# Patient Record
Sex: Female | Born: 1995 | Race: White | Hispanic: No | Marital: Single
Health system: Southern US, Community
[De-identification: ages and names within clinical notes are randomized; demographics above are authoritative.]

## PROBLEM LIST (undated history)

## (undated) DIAGNOSIS — J45909 Unspecified asthma, uncomplicated: Secondary | ICD-10-CM

## (undated) DIAGNOSIS — R51 Headache: Secondary | ICD-10-CM

## (undated) DIAGNOSIS — K219 Gastro-esophageal reflux disease without esophagitis: Secondary | ICD-10-CM

## (undated) HISTORY — DX: Unspecified asthma, uncomplicated: J45.909

## (undated) HISTORY — DX: Headache: R51

---
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
## 2017-03-17 ENCOUNTER — Ambulatory Visit (INDEPENDENT_AMBULATORY_CARE_PROVIDER_SITE_OTHER): Admitting: Family

## 2017-03-17 ENCOUNTER — Encounter (INDEPENDENT_AMBULATORY_CARE_PROVIDER_SITE_OTHER): Payer: Self-pay | Admitting: Family

## 2017-03-17 VITALS — BP 100/70 | HR 60 | Ht 65.0 in | Wt 118.8 lb

## 2017-03-17 DIAGNOSIS — M542 Cervicalgia: Secondary | ICD-10-CM | POA: Diagnosis not present

## 2017-03-17 DIAGNOSIS — G43109 Migraine with aura, not intractable, without status migrainosus: Secondary | ICD-10-CM | POA: Diagnosis not present

## 2017-03-17 DIAGNOSIS — Z8739 Personal history of other diseases of the musculoskeletal system and connective tissue: Secondary | ICD-10-CM

## 2017-03-17 DIAGNOSIS — G44229 Chronic tension-type headache, not intractable: Secondary | ICD-10-CM | POA: Diagnosis not present

## 2017-03-17 DIAGNOSIS — G43009 Migraine without aura, not intractable, without status migrainosus: Secondary | ICD-10-CM

## 2017-03-17 MED ORDER — TOPIRAMATE 25 MG PO TABS: ORAL_TABLET | ORAL | 5 refills | Status: DC

## 2017-03-17 MED ORDER — TIZANIDINE HCL 2 MG PO TABS: ORAL_TABLET | ORAL | 5 refills | Status: DC

## 2017-03-17 NOTE — Progress Notes (Signed)
Patient: Hailey Hodge MRN: [id number] Sex: female DOB: [birth year]  Provider: Elveria Rising, NP Location of Care: [facility] Child Neurology  Note type: Routine return visit  History of Present Illness: Referral Source: Juleen China MD History from: patient and [facility] chart Chief Complaint: Follow up on Migraines and needs refill on Zanaflex Hailey Hodge is a 21 year old young woman with history of tension and migraine headaches. She was last seen April 22, 2016. Hailey Hodge is taking and tolerating Topiramate 50mg  for prevention of migraine headaches. She also has tension headaches that typically do not require treatment. Hailey Hodge tells me today that she was doing well until about a couple of months ago and decided to stop taking Topiramate. She did so and had no migraines for about a month, then had a severe migraine. She decided to restart the medication and has done well since then.   When Hailey Hodge has a migraine, she takes Ibuprofen 600mg  or Tylenol. She sometimes needs to take Tizanidine or Promethazine if the migraine is particularly severe. She usually has to sleep to get relief.   Hailey Hodge is attending [employer] and says that she is doing well academically. She hopes to transfer to [employer] after [employer] her coursework at the [employer].   Darshay has been generally health since she was last seen. She has no other health concerns today other than previously mentioned.  Review of Systems: Please see the HPI for neurologic and other pertinent review of systems. Otherwise, the following systems are noncontributory including constitutional, eyes, ears, nose and throat, cardiovascular, respiratory, gastrointestinal, genitourinary, musculoskeletal, skin, endocrine, hematologic/lymph, allergic/immunologic and psychiatric.   Past Medical History:  Diagnosis Date  . Asthma May 2013  . Headache(784.0)    Hospitalizations: No., Head Injury: No.,  Nervous System Infections: No., Immunizations up to date: Yes.   Past Medical History Comments: See history  Surgical History Past Surgical History:  Procedure Laterality Date  . SALPINGOOPHORECTOMY  04/08/98    Family History family history includes Anxiety disorder in her mother; Bipolar disorder in her maternal grandmother; Colon cancer in her maternal grandfather; Depression in her maternal grandmother; Migraines in her maternal grandmother and mother; Stroke in her paternal grandfather. Family History is otherwise negative for migraines, seizures, cognitive impairment, blindness, deafness, birth defects, chromosomal disorder, autism.  Social History Social History   Social History  . Marital status: Single    Spouse name: N/A  . Number of children: N/A  . Years of education: N/A   Social History Main Topics  . Smoking status: Passive Smoke Exposure - Never Smoker  . Smokeless tobacco: Never Used  . Alcohol use No  . Drug use: No  . Sexual activity: No   Other Topics Concern  . 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 She will be attending [employer].    [name] lives her mom and her sister.   Hailey Hodge enjoys homework, TV, social media, book clubs, key club, music and dance.   Hailey Hodge does well in school.    Allergies No Known Allergies  Physical Exam BP 100/70   Pulse 60   Ht 5\' 5"  (1.651 m)   Wt 118 lb 12.8 oz (53.9 kg)   BMI 19.77 kg/m  General: well developed, well nourished girl, seated on exam table, in no evident distress. Right handed.  Head: head normocephalic and atraumatic. Ears, Nose and Throat: Oropharynx benign. Neck: supple with no carotid or supraclavicular bruits.  Respiratory: lungs clear to auscultation  Cardiovascular: regular rate and rhythm, no murmurs  Musculoskeletal: No obvious deformities or scoliosis. She has full range of motion and is able to perform flexion, extension and rotation maneuvers  of her spine. Skin: no rashes or lesions   Neurologic Exam  Mental Status: Awake and fully alert. Oriented to place and time. Recent and remote memory intact. Attention span, concentration, and fund of knowledge appropriate. Mood and affect appropriate.  Cranial Nerves: Fundoscopic exam reveals sharp disc margins. Pupils equal, briskly reactive to light. Extraocular movements full without nystagmus. Visual fields full to confrontation. Hearing intact and symmetric to finger rub. Facial sensation intact. Face, tongue, palate move normally and symmetrically. Neck flexion and extension normal.  Motor: Normal bulk and tone. Normal strength in all tested extremity muscles.  Sensory: Intact to touch and temperature in all extremities.  Coordination: Rapid alternating movements normal in all extremities. Finger-to-nose and heel-to-shin performed accurately bilaterally. Romberg negative.  Gait and Station: Arises from chair without difficulty. Stance is normal. Gait demonstrates normal stride length and balance. Able to heel, toe, and tandem walk without difficulty. Gower negative.  Reflexes: Diminished and symmetric. Toes downgoing.  Impression 1. Migraine without aura. 2. She has had episodes of migraine with aura but it is rare 3. Episodic tension headache  4. Neck pain 5. History of asthma 6. History of scoliosis  Recommendations for plan of care The [facility] were [facility]. [name] has neither had nor required imaging or lab studies since the last visit. She is a 21 year old young woman with history of tension and migraine headaches. She is taking and toleratingTopiramate for migraine prevention. We talked about her headaches and I reminded [name] of usual migraine triggers such as skipping meals, not drinking enough water, sleep deprivation and stress. She will continue her medication without change for now. 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 I reminded Aerie that these medications should not be taken by pregnant women and that she should take appropriate precautions if she becomes sexually active. I will see Francessca back in follow up in 6 months or sooner if needed.   The medication list was reviewed and reconciled.  No changes were made in the prescribed medications today.  A complete medication list was provided to the patient.  Allergies as of 03/17/2017   No Known Allergies     Medication List       Accurate as of 03/17/17  7:28 PM. Always use your most recent med list.          acetaminophen 325 MG tablet Commonly known as:  TYLENOL Take 2 tablets at onset of headache, may repeat every 4-6 hours as needed.   albuterol 108 (90 Base) MCG/ACT inhaler Commonly known as:  PROVENTIL HFA;VENTOLIN HFA Inhale 2 puffs into the lungs every 6 (six) hours as needed for wheezing.   ibuprofen 600 MG tablet Commonly known as:  ADVIL,MOTRIN Take 1 tablet at onset of migraine. May repeat in 6 hours if needed.   LO LOESTRIN FE 1 MG-10 MCG / 10 MCG tablet Generic drug:  Norethindrone-Ethinyl Estradiol-Fe Biphas TAKE 1 TABLET BY MOUTH DAILY   montelukast 10 MG tablet Commonly known as:  SINGULAIR Take 10 mg by mouth.   promethazine 12.5 MG tablet Commonly known as:  PHENERGAN Take  1 tablet at onset of nausea with severe migraines. May repeat in 6 hours if needed.   tiZANidine 2 MG tablet Commonly known as:  ZANAFLEX Take 1 tablet at bedtime when needed for neck pain   topiramate 25 MG tablet Commonly known as:  TOPAMAX Take 2 tablets at bedtime       Total time spent with the patient was 25 minutes, of which 50% or more was spent in counseling and coordination of care.   Elveria Rising NP-C

## 2017-03-17 NOTE — Patient Instructions (Signed)
Continue your medication as you have been taking it.   Let me know if your headaches become more frequent or more severe.   Remember to avoid skipping meals, to drink plenty of water and to get 8 to 9 hours of sleep each night to avoid triggering headaches.   Also remember that Topiramate can make oral contraceptives potentially less effective as contraception and if you are sexually active, that a second barrier method such as condoms should be used.   Please sign up for MyChart.   Please plan to return for follow up in 6 months or sooner if needed.

## 2018-12-01 ENCOUNTER — Ambulatory Visit (INDEPENDENT_AMBULATORY_CARE_PROVIDER_SITE_OTHER): Payer: Self-pay | Admitting: Family

## 2018-12-02 ENCOUNTER — Encounter (INDEPENDENT_AMBULATORY_CARE_PROVIDER_SITE_OTHER): Payer: Self-pay | Admitting: Family

## 2018-12-02 ENCOUNTER — Ambulatory Visit (INDEPENDENT_AMBULATORY_CARE_PROVIDER_SITE_OTHER): Admitting: Family

## 2018-12-02 ENCOUNTER — Other Ambulatory Visit: Payer: Self-pay

## 2018-12-02 VITALS — BP 108/72 | HR 72 | Ht 65.25 in | Wt 124.0 lb

## 2018-12-02 DIAGNOSIS — G43009 Migraine without aura, not intractable, without status migrainosus: Secondary | ICD-10-CM | POA: Diagnosis not present

## 2018-12-02 DIAGNOSIS — Z8739 Personal history of other diseases of the musculoskeletal system and connective tissue: Secondary | ICD-10-CM

## 2018-12-02 DIAGNOSIS — G43109 Migraine with aura, not intractable, without status migrainosus: Secondary | ICD-10-CM | POA: Diagnosis not present

## 2018-12-02 DIAGNOSIS — G44229 Chronic tension-type headache, not intractable: Secondary | ICD-10-CM | POA: Diagnosis not present

## 2018-12-02 MED ORDER — IBUPROFEN 600 MG PO TABS: ORAL_TABLET | ORAL | 5 refills | Status: DC

## 2018-12-02 NOTE — Patient Instructions (Signed)
Thank you for coming in today.   Instructions for you until your next appointment are as follows: 1. Remember that it is important for you to be well hydrated, to avoid skipping meals and to get at least 8-9 hours of sleep each night.  2. Let me know if your headaches become more frequent or more severe.  3. Please sign up for MyChart if you have not done so 4. Please plan to return for follow up in one year or sooner if needed.

## 2018-12-02 NOTE — Progress Notes (Signed)
Patient: Hailey Hodge MRN: [id number] Sex: female DOB: Mar 05, 1996  Provider: Elveria Rising, NP Location of Care: [facility] Child Neurology  Note type: Routine return visit  History of Present Illness: Referral Source: Juleen China, MD History from: patient and [facility] chart Chief Complaint: Migraines  Hailey Hodge is a 23 y.o. young woman with history of migraine and tension headaches. She was last seen March 17, 2017. At that time she was taking and tolerating Topiramate for migraine prevention. She tells me today that she has stopped it because her migraine frequency improved. She has not experienced recurrence of frequent migraines since stopping the Topiramate. Hailey Hodge reports that she has occasional migraine that is easily managed with Ibuprofen and increasing water intake. With migraines Hailey Hodge has visual disturbance and holocephalic pain. She also has occasional tension headaches that are milder frontal pain, typically relieved by rest and hydration.  Hailey Hodge is attending [employer] and majoring in international studies. She is currently enrolled in online classes because of restrictions related to Covid-19 pandemic. She says that classes are going well for the most part. She is having some difficulty in her foreign language class ([place]) but says the professor is working with her.   Hailey Hodge has been otherwise generally healthy since she was last seen. She has no other health concerns today other than previously mentioned.  Review of Systems: Please see the HPI for neurologic and other pertinent review of systems. Otherwise, all other systems were reviewed and were negative.    Past Medical History:  Diagnosis Date  . Asthma May 2013  . Headache(784.0)    Hospitalizations: No., Head Injury: No., Nervous System Infections: No., Immunizations up to date: Yes.   Past Medical History Comments: See HPI   Surgical History Past Surgical History:  Procedure Laterality Date   . SALPINGOOPHORECTOMY  12-03-97    Family History family history includes Anxiety disorder in her mother; Bipolar disorder in her maternal grandmother; Colon cancer in her maternal grandfather; Depression in her maternal grandmother; Migraines in her maternal grandmother and mother; Stroke in her paternal grandfather. Family History is otherwise negative for migraines, seizures, cognitive impairment, blindness, deafness, birth defects, chromosomal disorder, autism.  Social History Social History   Socioeconomic History  . Marital status: Single    Spouse name: Not on file  . Number of children: Not on file  . Years of education: Not on file  . Highest education level: Not on file  Occupational History  . Not on file  Social Needs  . Financial resource strain: Not on file  . Food insecurity:    Worry: Not on file    Inability: Not on file  . Transportation needs:    Medical: Not on file    Non-medical: Not on file  Tobacco Use  . Smoking status: Passive Smoke Exposure - Never Smoker  . Smokeless tobacco: Never Used  Substance and Sexual Activity  . Alcohol use: No  . Drug use: No  . Sexual activity: Never  Lifestyle  . Physical activity:    Days per week: Not on file    Minutes per session: Not on file  . Stress: Not on file  Relationships  . Social connections:    Talks on phone: Not on file    Gets together: Not on file    Attends religious service: Not on file    Active member of club or organization: Not on file    Attends meetings of clubs or organizations: Not  on file    Relationship status: Not on file  Other Topics Concern  . Not on file  Social History Narrative   Mialani graduated from [employer].    She is a Holiday representative at [employer].   She lives with her mom and her sister.   Bayleigh enjoys homework, TV, social media, book clubs, key club, music and dance.   Working at [employer]     Allergies Allergies  Allergen Reactions  . Prednisone      Other reaction(s): Other (See Comments) "my muscles tightened up"    Physical Exam BP 108/72   Pulse 72   Ht 5' 5.25" (1.657 m)   Wt 124 lb (56.2 kg)   BMI 20.48 kg/m  General: Well developed, well nourished young woman, seated on exam table, in no evident distress, sandy hair, brown eyes, right handed Head: Head normocephalic and atraumatic.  Oropharynx benign. Neck: Supple with no carotid bruits Cardiovascular: Regular rate and rhythm, no murmurs Respiratory: Breath sounds clear to auscultation Musculoskeletal: No obvious deformities or scoliosis Skin: No rashes or neurocutaneous lesions  Neurologic Exam Mental Status: Awake and fully alert.  Oriented to place and time.  Recent and remote memory intact.  Attention span, concentration, and fund of knowledge appropriate.  Mood and affect appropriate. Cranial Nerves: Fundoscopic exam reveals sharp disc margins.  Pupils equal, briskly reactive to light.  Extraocular movements full without nystagmus.  Visual fields full to confrontation.  Hearing intact and symmetric to finger rub.  Facial sensation intact.  Face tongue, palate move normally and symmetrically.  Neck flexion and extension normal. Motor: Normal bulk and tone. Normal strength in all tested extremity muscles. Sensory: Intact to touch and temperature in all extremities.  Coordination: Rapid alternating movements normal in all extremities.  Finger-to-nose and heel-to shin performed accurately bilaterally.  Romberg negative. Gait and Station: Arises from chair without difficulty.  Stance is normal. Gait demonstrates normal stride length and balance.   Able to heel, toe and tandem walk without difficulty. Reflexes: 1+ and symmetric. Toes downgoing.  Impression 1. Migraine without aura 2. Rare episodes of migraine with aura 3. Tension headaches   PHQ-SADS Score Only 12/02/2018  PHQ-15 3  GAD-7 0  Anxiety attacks No  PHQ-9 0  Suicidal Ideation No  Any difficulty to complete  tasks? Not difficult at all     Recommendations for plan of care The patient's previous [facility] records were reviewed. Hailey Hodge has neither had nor required imaging or lab studies since the last visit. She is a 23 year old woman with history of migraine and tension headaches. She used to take Topiramate for migraine prevention but stopped it when the migraine frequency diminished. She takes Ibuprofen and drinks water when a migraine occurs, and this has worked to give her relief. She has occasional tension headaches that are relieved by rest and hydration. I reminded Hailey Hodge of the need to drink plenty of water each day, to avoid skipping meals and to get at least 8-9 hours of sleep each night. I will see her back in follow up in 1 year or sooner if needed. Hailey Hodge agreed with the plans made today.   The medication list was reviewed and reconciled.  No changes were made in the prescribed medications today.  A complete medication list was provided to the patient.  Allergies as of 12/02/2018      Reactions   Prednisone    Other reaction(s): Other (See Comments) "my muscles tightened up"  Medication List       Accurate as of December 02, 2018 11:38 AM. Always use your most recent med list.        acetaminophen 325 MG tablet Commonly known as:  TYLENOL Take 2 tablets at onset of headache, may repeat every 4-6 hours as needed.   albuterol 108 (90 Base) MCG/ACT inhaler Commonly known as:  PROVENTIL HFA;VENTOLIN HFA Inhale 2 puffs into the lungs every 6 (six) hours as needed for wheezing.   ibuprofen 600 MG tablet Commonly known as:  ADVIL,MOTRIN Take 1 tablet at onset of migraine. May repeat in 6 hours if needed.   Lo Loestrin Fe 1 MG-10 MCG / 10 MCG tablet Generic drug:  Norethindrone-Ethinyl Estradiol-Fe Biphas TAKE 1 TABLET BY MOUTH DAILY   montelukast 10 MG tablet Commonly known as:  SINGULAIR Take 10 mg by mouth.   norethindrone-ethinyl estradiol 1-20 MG-MCG tablet Commonly  known as:  MICROGESTIN,JUNEL,LOESTRIN TK 1 T PO QD       Total time spent with the patient was 20 minutes, of which 50% or more was spent in counseling and coordination of care.   Elveria Rising NP-C

## 2019-03-26 DIAGNOSIS — Z01419 Encounter for gynecological examination (general) (routine) without abnormal findings: Secondary | ICD-10-CM | POA: Diagnosis not present

## 2019-04-11 DIAGNOSIS — Z20828 Contact with and (suspected) exposure to other viral communicable diseases: Secondary | ICD-10-CM | POA: Diagnosis not present

## 2019-07-20 DIAGNOSIS — Z23 Encounter for immunization: Secondary | ICD-10-CM | POA: Diagnosis not present

## 2019-07-26 DIAGNOSIS — J00 Acute nasopharyngitis [common cold]: Secondary | ICD-10-CM | POA: Diagnosis not present

## 2019-08-30 DIAGNOSIS — R05 Cough: Secondary | ICD-10-CM | POA: Diagnosis not present

## 2019-11-15 DIAGNOSIS — Z20828 Contact with and (suspected) exposure to other viral communicable diseases: Secondary | ICD-10-CM | POA: Diagnosis not present

## 2019-11-15 DIAGNOSIS — J019 Acute sinusitis, unspecified: Secondary | ICD-10-CM | POA: Diagnosis not present

## 2020-03-01 DIAGNOSIS — Z20828 Contact with and (suspected) exposure to other viral communicable diseases: Secondary | ICD-10-CM | POA: Diagnosis not present

## 2020-03-01 DIAGNOSIS — J029 Acute pharyngitis, unspecified: Secondary | ICD-10-CM | POA: Diagnosis not present

## 2020-03-01 DIAGNOSIS — J0100 Acute maxillary sinusitis, unspecified: Secondary | ICD-10-CM | POA: Diagnosis not present

## 2020-07-10 DIAGNOSIS — J329 Chronic sinusitis, unspecified: Secondary | ICD-10-CM | POA: Diagnosis not present

## 2020-07-10 DIAGNOSIS — Z20828 Contact with and (suspected) exposure to other viral communicable diseases: Secondary | ICD-10-CM | POA: Diagnosis not present

## 2020-08-01 DIAGNOSIS — Z3041 Encounter for surveillance of contraceptive pills: Secondary | ICD-10-CM | POA: Diagnosis not present

## 2020-11-16 DIAGNOSIS — Z1331 Encounter for screening for depression: Secondary | ICD-10-CM | POA: Diagnosis not present

## 2020-11-16 DIAGNOSIS — Z6821 Body mass index (BMI) 21.0-21.9, adult: Secondary | ICD-10-CM | POA: Diagnosis not present

## 2020-11-16 DIAGNOSIS — K219 Gastro-esophageal reflux disease without esophagitis: Secondary | ICD-10-CM | POA: Diagnosis not present

## 2020-11-16 DIAGNOSIS — Z23 Encounter for immunization: Secondary | ICD-10-CM | POA: Diagnosis not present

## 2020-11-16 DIAGNOSIS — Z0000 Encounter for general adult medical examination without abnormal findings: Secondary | ICD-10-CM | POA: Diagnosis not present

## 2022-06-08 ENCOUNTER — Encounter (HOSPITAL_BASED_OUTPATIENT_CLINIC_OR_DEPARTMENT_OTHER): Payer: Self-pay

## 2022-06-08 ENCOUNTER — Emergency Department (HOSPITAL_BASED_OUTPATIENT_CLINIC_OR_DEPARTMENT_OTHER)
Admission: EM | Admit: 2022-06-08 | Discharge: 2022-06-09 | Disposition: A | Discharge status: Home / Self Care | Payer: BC Managed Care – PPO | Attending: Emergency Medicine | Admitting: Emergency Medicine

## 2022-06-08 ENCOUNTER — Other Ambulatory Visit: Payer: Self-pay

## 2022-06-08 ENCOUNTER — Emergency Department (HOSPITAL_BASED_OUTPATIENT_CLINIC_OR_DEPARTMENT_OTHER): Payer: BC Managed Care – PPO

## 2022-06-08 DIAGNOSIS — W228XXA Striking against or struck by other objects, initial encounter: Secondary | ICD-10-CM | POA: Insufficient documentation

## 2022-06-08 DIAGNOSIS — S060X0A Concussion without loss of consciousness, initial encounter: Secondary | ICD-10-CM | POA: Insufficient documentation

## 2022-06-08 DIAGNOSIS — S0990XA Unspecified injury of head, initial encounter: Secondary | ICD-10-CM | POA: Diagnosis present

## 2022-06-08 LAB — CBC WITH DIFFERENTIAL/PLATELET
Abs Immature Granulocytes: 0.01 10*3/uL (ref 0.00–0.07)
Basophils Absolute: 0 10*3/uL (ref 0.0–0.1)
Basophils Relative: 1 %
Eosinophils Absolute: 0.1 10*3/uL (ref 0.0–0.5)
Eosinophils Relative: 1 %
HCT: 36.7 % (ref 36.0–46.0)
Hemoglobin: 12.3 g/dL (ref 12.0–15.0)
Immature Granulocytes: 0 %
Lymphocytes Relative: 46 %
Lymphs Abs: 2.4 10*3/uL (ref 0.7–4.0)
MCH: 29.9 pg (ref 26.0–34.0)
MCHC: 33.5 g/dL (ref 30.0–36.0)
MCV: 89.3 fL (ref 80.0–100.0)
Monocytes Absolute: 0.3 10*3/uL (ref 0.1–1.0)
Monocytes Relative: 6 %
Neutro Abs: 2.4 10*3/uL (ref 1.7–7.7)
Neutrophils Relative %: 46 %
Platelets: 207 10*3/uL (ref 150–400)
RBC: 4.11 MIL/uL (ref 3.87–5.11)
RDW: 12.3 % (ref 11.5–15.5)
WBC: 5.3 10*3/uL (ref 4.0–10.5)
nRBC: 0 % (ref 0.0–0.2)

## 2022-06-08 LAB — BASIC METABOLIC PANEL
Anion gap: 4 — ABNORMAL LOW (ref 5–15)
BUN: 11 mg/dL (ref 6–20)
CO2: 24 mmol/L (ref 22–32)
Calcium: 8.1 mg/dL — ABNORMAL LOW (ref 8.9–10.3)
Chloride: 109 mmol/L (ref 98–111)
Creatinine, Ser: 0.68 mg/dL (ref 0.44–1.00)
GFR, Estimated: >60 mL/min (ref >60)
Glucose, Bld: 96 mg/dL (ref 70–99)
Potassium: 3.6 mmol/L (ref 3.5–5.1)
Sodium: 137 mmol/L (ref 135–145)

## 2022-06-08 MED ORDER — MECLIZINE HCL 25 MG PO TABS
25.0000 mg | ORAL_TABLET | Freq: Once | ORAL | Status: AC
  Administered 2022-06-08: 25 mg via ORAL
  Filled 2022-06-08: qty 1

## 2022-06-08 MED ORDER — THIAMINE HCL 100 MG/ML IJ SOLN: 500.0000 mg | Freq: Once | INTRAMUSCULAR | Status: DC

## 2022-06-08 MED ORDER — IOHEXOL 350 MG/ML SOLN
75.0000 mL | Freq: Once | INTRAVENOUS | Status: AC | PRN
  Administered 2022-06-08: 75 mL via INTRAVENOUS

## 2022-06-08 NOTE — ED Provider Notes (Signed)
[facility] EMERGENCY DEPARTMENT Provider Note   CSN: [id number] Arrival date & time: 06/08/22  1932     History {Add pertinent medical, surgical, social history, OB history to HPI:1} Chief Complaint  Patient presents with   Head Injury    Hailey Hodge is a 26 y.o. female who presents for evaluation of head injury.  Patient was head butted by her dog last night in the eye.  She had her head whipped back and forth.  She was seen earlier today at an urgent care and diagnosed with concussion but told that she needed to come for further evaluation if she should develop any new symptoms.  Since that time she has developed significant pain in her right neck.  She endorses headache, nausea, feelings of imbalance, she has significant right-sided headache, symptoms are worse when she lays back.  She feels the room spinning.  She has not had any vomiting.   Head Injury      Home Medications Prior to Admission medications   Medication Sig Start Date End Date Taking? Authorizing Provider  acetaminophen (TYLENOL) 325 MG tablet Take 2 tablets at onset of headache, may repeat every 4-6 hours as needed.    [provider]  albuterol (PROVENTIL HFA;VENTOLIN HFA) 108 (90 BASE) MCG/ACT inhaler Inhale 2 puffs into the lungs every 6 (six) hours as needed for wheezing.    [provider]  ibuprofen (ADVIL,MOTRIN) 600 MG tablet Take 1 tablet at onset of migraine. May repeat in 6 hours if needed. 12/02/18   Rockwell Germany, NP  montelukast (SINGULAIR) 10 MG tablet Take 10 mg by mouth.    [provider]  norethindrone-ethinyl estradiol (MICROGESTIN,JUNEL,LOESTRIN) 1-20 MG-MCG tablet TK 1 T PO QD 11/30/18   [provider]  Norethindrone-Ethinyl Estradiol-Fe Biphas (LO LOESTRIN FE) 1 MG-10 MCG / 10 MCG tablet TAKE 1 TABLET BY MOUTH DAILY 04/14/16   [provider]      Allergies    Prednisone    Review of Systems   Review of Systems  Physical  Exam Updated Vital Signs BP 123/81 (BP Location: Right Arm)   Pulse 74   Temp 98.8 F (37.1 C) (Oral)   Resp 18   Ht 5\' 5"  (1.651 m)   Wt 59 kg   SpO2 100%   BMI 21.63 kg/m  Physical Exam Vitals and nursing note reviewed.  Constitutional:      General: She is not in acute distress.    Appearance: She is well-developed. She is not diaphoretic.  HENT:     Head: Normocephalic and atraumatic.     Right Ear: External ear normal.     Left Ear: External ear normal.     Nose: Nose normal.     Mouth/Throat:     Mouth: Mucous membranes are moist.  Eyes:     General: No scleral icterus.    Conjunctiva/sclera: Conjunctivae normal.     Pupils: Pupils are equal, round, and reactive to light.     Comments: No horizontal, vertical or rotational nystagmus  Neck:     Comments: Full active and passive ROM without pain No midline or paraspinal tenderness No nuchal rigidity or meningeal signs Cardiovascular:     Rate and Rhythm: Normal rate and regular rhythm.     Heart sounds: Normal heart sounds. No murmur heard.    No friction rub. No gallop.  Pulmonary:     Effort: Pulmonary effort is normal. No respiratory distress.     Breath sounds:  Normal breath sounds. No wheezing or rales.  Abdominal:     General: Bowel sounds are normal. There is no distension.     Palpations: Abdomen is soft. There is no mass.     Tenderness: There is no abdominal tenderness. There is no guarding or rebound.  Musculoskeletal:        General: Normal range of motion.     Cervical back: Normal range of motion and neck supple.  Lymphadenopathy:     Cervical: No cervical adenopathy.  Skin:    General: Skin is warm and dry.     Findings: No rash.  Neurological:     Mental Status: She is alert and oriented to person, place, and time.     Cranial Nerves: No cranial nerve deficit.     Motor: No abnormal muscle tone.     Coordination: Coordination normal.     Comments: Mental Status:  Alert, oriented, thought  content appropriate. Speech fluent without evidence of aphasia. Able to follow 2 step commands without difficulty.  Cranial Nerves:  II:  Peripheral visual fields grossly normal, pupils equal, round, reactive to light III,IV, VI: ptosis not present, extra-ocular motions intact bilaterally  V,VII: smile symmetric, facial light touch sensation equal VIII: hearing grossly normal bilaterally  IX,X: midline uvula rise  XI: bilateral shoulder shrug equal and strong XII: midline tongue extension  Motor:  5/5 in upper and lower extremities bilaterally including strong and equal grip strength and dorsiflexion/plantar flexion Sensory: Pinprick and light touch normal in all extremities.  Cerebellar: normal finger-to-nose with bilateral upper extremities Gait: normal gait and balance CV: distal pulses palpable throughout   Psychiatric:        Behavior: Behavior normal.        Thought Content: Thought content normal.        Judgment: Judgment normal.     ED Results / Procedures / Treatments   Labs (all labs ordered are listed, but only abnormal results are displayed) Labs Reviewed  BASIC METABOLIC PANEL - Abnormal; Notable for the following components:      Result Value   Calcium 8.1 (*)    Anion gap 4 (*)    All other components within normal limits  CBC WITH DIFFERENTIAL/PLATELET    EKG None  Radiology No results found.  Procedures Procedures  {Document cardiac monitor, telemetry assessment procedure when appropriate:1}  Medications Ordered in ED Medications  meclizine (ANTIVERT) tablet 25 mg (25 mg Oral Given 06/08/22 2242)    ED Course/ Medical Decision Making/ A&P                           Medical Decision Making Amount and/or Complexity of Data Reviewed Labs: ordered. Radiology: ordered.   ***  {Document critical care time when appropriate:1} {Document review of labs and clinical decision tools ie heart score, Chads2Vasc2 etc:1}  {Document your independent review  of radiology images, and any outside records:1} {Document your discussion with family members, caretakers, and with consultants:1} {Document social determinants of health affecting pt's care:1} {Document your decision making why or why not admission, treatments were needed:1} Final Clinical Impression(s) / ED Diagnoses Final diagnoses:  None    Rx / DC Orders ED Discharge Orders     None

## 2022-06-08 NOTE — ED Notes (Signed)
ED Provider at bedside. 

## 2022-06-08 NOTE — ED Notes (Signed)
Patient transported to CT 

## 2022-06-08 NOTE — ED Triage Notes (Signed)
Pt reports she got hit in head by dog last night and went to UC today and dx with concussion w/o LOC. Pt has injury to under RT eye; endorses HA and intermittent nausea and feels she has "slow processing". Is here because she has new neck pain.

## 2022-06-09 MED ORDER — NAPROXEN 375 MG PO TABS: 375.0000 mg | ORAL_TABLET | Freq: Two times a day (BID) | ORAL | 0 refills | Status: AC

## 2022-06-09 MED ORDER — MECLIZINE HCL 25 MG PO TABS: 25.0000 mg | ORAL_TABLET | Freq: Three times a day (TID) | ORAL | 0 refills | Status: DC | PRN

## 2022-06-09 NOTE — Discharge Instructions (Addendum)
Contact a health care provider if: °Your symptoms do not improve. °You have new symptoms. °You have another injury. °Get help right away if: °You have new or worsening physical symptoms, such as: °A severe or worsening headache. °Weakness or numbness in any part of your body, slurred speech, vision changes, or confusion. °Your coordination gets worse. °Vomiting repeatedly. °You have a seizure. °You have unusual behavior changes. °You lose consciousness, are sleepier than normal, or are difficult to wake up. °

## 2023-03-04 ENCOUNTER — Emergency Department (HOSPITAL_BASED_OUTPATIENT_CLINIC_OR_DEPARTMENT_OTHER)
Admission: EM | Admit: 2023-03-04 | Discharge: 2023-03-04 | Disposition: A | Discharge status: Home / Self Care | Payer: BC Managed Care – PPO | Attending: Emergency Medicine | Admitting: Emergency Medicine

## 2023-03-04 ENCOUNTER — Encounter (HOSPITAL_BASED_OUTPATIENT_CLINIC_OR_DEPARTMENT_OTHER): Payer: Self-pay | Admitting: Emergency Medicine

## 2023-03-04 ENCOUNTER — Other Ambulatory Visit: Payer: Self-pay

## 2023-03-04 DIAGNOSIS — R0602 Shortness of breath: Secondary | ICD-10-CM

## 2023-03-04 DIAGNOSIS — R062 Wheezing: Secondary | ICD-10-CM

## 2023-03-04 DIAGNOSIS — J45901 Unspecified asthma with (acute) exacerbation: Secondary | ICD-10-CM

## 2023-03-04 MED ORDER — ALBUTEROL SULFATE (2.5 MG/3ML) 0.083% IN NEBU
2.5000 mg | INHALATION_SOLUTION | Freq: Once | RESPIRATORY_TRACT | Status: AC
  Administered 2023-03-04: 2.5 mg via RESPIRATORY_TRACT
  Filled 2023-03-04: qty 3

## 2023-03-04 MED ORDER — METHYLPREDNISOLONE 4 MG PO TBPK: ORAL_TABLET | ORAL | 0 refills | Status: DC

## 2023-03-04 MED ORDER — IPRATROPIUM-ALBUTEROL 0.5-2.5 (3) MG/3ML IN SOLN
3.0000 mL | Freq: Once | RESPIRATORY_TRACT | Status: AC
  Administered 2023-03-04: 3 mL via RESPIRATORY_TRACT
  Filled 2023-03-04: qty 3

## 2023-03-04 NOTE — Discharge Instructions (Signed)
Your history, exam, and workup today are consistent with wheezing and asthma exacerbation that may be related to the recent chemical exposures.  I would rest and try to avoid any further exposures and consider taking the Medrol Dosepak steroids.  I spoke with pharmacy and they felt this was likely the safest option for you.  Please rest and stay hydrated.  If symptoms change or worsen acutely, please return to the nearest emergency department.

## 2023-03-04 NOTE — ED Triage Notes (Signed)
Pt POV c/o ShOB today, has used rescue inhaler x3 today. Reports no improvement. Denies fevers. Reports non productive cough.

## 2023-03-04 NOTE — ED Provider Notes (Signed)
EMERGENCY DEPARTMENT AT [facility] Provider Note   CSN: [id number] Arrival date & time: 03/04/23  2153     History  Chief Complaint  Patient presents with   Shortness of Breath    Hailey Hodge is a 27 y.o. female.  The history is provided by the patient and medical records. No language interpreter was used.  Shortness of Breath Severity:  Moderate Onset quality:  Gradual Duration:  2 days Timing:  Constant Progression:  Waxing and waning Chronicity:  Recurrent Context: animal exposure, occupational exposure and strong odors   Context: not URI   Relieved by:  Nothing Worsened by:  Nothing Ineffective treatments:  Inhaler Associated symptoms: cough and wheezing   Associated symptoms: no abdominal pain, no chest pain, no diaphoresis, no ear pain, no fever, no headaches, no hemoptysis, no neck pain, no rash, no sputum production and no vomiting   Risk factors: no hx of PE/DVT        Home Medications Prior to Admission medications   Medication Sig Start Date End Date Taking? Authorizing Provider  acetaminophen (TYLENOL) 325 MG tablet Take 2 tablets at onset of headache, may repeat every 4-6 hours as needed.    [provider]  albuterol (PROVENTIL HFA;VENTOLIN HFA) 108 (90 BASE) MCG/ACT inhaler Inhale 2 puffs into the lungs every 6 (six) hours as needed for wheezing.    [provider]  ibuprofen (ADVIL,MOTRIN) 600 MG tablet Take 1 tablet at onset of migraine. May repeat in 6 hours if needed. 12/02/18   Elveria Rising, NP  meclizine (ANTIVERT) 25 MG tablet Take 1 tablet (25 mg total) by mouth 3 (three) times daily as needed for dizziness. 06/09/22   Harris, Abigail, PA-C  montelukast (SINGULAIR) 10 MG tablet Take 10 mg by mouth.    [provider]  naproxen (NAPROSYN) 375 MG tablet Take 1 tablet (375 mg total) by mouth 2 (two) times daily with a meal. 06/09/22   Arthor Captain, PA-C  norethindrone-ethinyl estradiol  (MICROGESTIN,JUNEL,LOESTRIN) 1-20 MG-MCG tablet TK 1 T PO QD 11/30/18   [provider]  Norethindrone-Ethinyl Estradiol-Fe Biphas (LO LOESTRIN FE) 1 MG-10 MCG / 10 MCG tablet TAKE 1 TABLET BY MOUTH DAILY 04/14/16   [provider]      Allergies    Prednisone    Review of Systems   Review of Systems  Constitutional:  Negative for chills, diaphoresis and fever.  HENT:  Negative for congestion and ear pain.   Respiratory:  Positive for cough, chest tightness, shortness of breath and wheezing. Negative for hemoptysis, sputum production and stridor.   Cardiovascular:  Negative for chest pain, palpitations and leg swelling.  Gastrointestinal:  Negative for abdominal pain, constipation, diarrhea, nausea and vomiting.  Genitourinary:  Negative for dysuria.  Musculoskeletal:  Negative for back pain and neck pain.  Skin:  Negative for rash.  Neurological:  Negative for light-headedness and headaches.  Psychiatric/Behavioral:  Negative for agitation.   All other systems reviewed and are negative.   Physical Exam Updated Vital Signs BP (!) 123/90   Pulse 81   Temp 97.8 F (36.6 C) (Oral)   Resp 17   Ht 5\' 5"  (1.651 m)   Wt 63.5 kg   SpO2 100%   BMI 23.30 kg/m  Physical Exam Vitals and nursing note reviewed.  Constitutional:      General: She is not in acute distress.    Appearance: She is well-developed. She is not ill-appearing, toxic-appearing or diaphoretic.  Interventions: She is not intubated. HENT:     Head: Normocephalic and atraumatic.     Mouth/Throat:     Pharynx: No pharyngeal swelling or oropharyngeal exudate.  Eyes:     Conjunctiva/sclera: Conjunctivae normal.  Cardiovascular:     Rate and Rhythm: Normal rate and regular rhythm.     Pulses: Normal pulses.     Heart sounds: No murmur heard. Pulmonary:     Effort: No tachypnea, accessory muscle usage or respiratory distress. She is not intubated.     Breath sounds: Decreased breath sounds and  wheezing present. No rhonchi or rales.  Chest:     Chest wall: No tenderness.  Abdominal:     Palpations: Abdomen is soft.     Tenderness: There is no abdominal tenderness.  Musculoskeletal:        General: No swelling.     Cervical back: Neck supple.     Right lower leg: No tenderness. No edema.     Left lower leg: No tenderness. No edema.  Skin:    General: Skin is warm and dry.     Capillary Refill: Capillary refill takes less than 2 seconds.     Coloration: Skin is not pale.     Findings: No erythema.  Neurological:     Mental Status: She is alert.  Psychiatric:        Mood and Affect: Mood normal.     ED Results / Procedures / Treatments   Labs (all labs ordered are listed, but only abnormal results are displayed) Labs Reviewed - No data to display  EKG None  Radiology No results found.  Procedures Procedures    Medications Ordered in ED Medications  ipratropium-albuterol (DUONEB) 0.5-2.5 (3) MG/3ML nebulizer solution 3 mL (3 mLs Nebulization Given 03/04/23 2230)  albuterol (PROVENTIL) (2.5 MG/3ML) 0.083% nebulizer solution 2.5 mg (2.5 mg Nebulization Given 03/04/23 2230)    ED Course/ Medical Decision Making/ A&P                             Medical Decision Making Risk Prescription drug management.    Hailey Hodge is a 27 y.o. female with a past medical history significant for asthma, migraines, and scoliosis who presents with wheezing and shortness of breath.  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  She reports she has had some wheezing and tried her inhaler several times yesterday and 3 times today but continued to have some shortness of breath and wheezing.  She reports some mild cough but it is not productive significant phlegm.  She reports no fevers or chills, denies nausea, vomiting, constipation,  diarrhea, or urinary changes.  Denies history of blood clots.  Patient otherwise states her daughter wheezing.   Patient reports she is allergic to prednisone and is unsure if she was taking her steroids in the past.  On exam, patient has some faint wheezing and diminished breath sounds bilaterally but there is no rales or rhonchi.  Chest and abdomen nontender.  Patient resting with oxygen saturation of 100%.  She is not tachycardic or tachypneic and is afebrile.  Rest of exam unremarkable.  We will give her a breathing treatment and reassess.  Given her lack of rhonchi, fever, or significant production of sputum, will hold on chest x-ray and  patient agrees.  If patient begins to improve, anticipate shared decision-making conversation about a different steroid and discharged to follow-up with her PCP.  Of note, she has a granddaughter of our respiratory therapist here and he reports antegradely that her lung sounds are more diminished than normal although he agrees with no rhonchi at this time.  Anticipate reassessment and possible discharge if breathing has improved.  Patient breathing much improved after breathing treatment.  She has no wheezing, rhonchi, and breath sounds are clear.  Moving much more air.  We had a shared decision-making conversation and agreed to hold on chest x-ray given the clear breath sounds after treatment.  Patient was interested in getting a prescription for steroids so I spoke with pharmacy who recommended a Medrol Dosepak that the safest option for her.  Patient was printed and she can decide if she wants to fill it tomorrow.  She will follow-up with her primary doctor and try to avoid the aggravating chemicals.  She noted presents with concerns and was discharged in good condition.         Final Clinical Impression(s) / ED Diagnoses Final diagnoses:  SOB (shortness of breath)  Wheezing  Exacerbation of asthma, unspecified asthma severity, unspecified  whether persistent    Rx / DC Orders ED Discharge Orders          Ordered    methylPREDNISolone (MEDROL DOSEPAK) 4 MG TBPK tablet        03/04/23 2325            Clinical Impression: 1. SOB (shortness of breath)   2. Wheezing   3. Exacerbation of asthma, unspecified asthma severity, unspecified whether persistent     Disposition: Discharge  Condition: Good  I have discussed the results, Dx and Tx plan with the pt(& family if present). He/she/they expressed understanding and agree(s) with the plan. Discharge instructions discussed at great length. Strict return precautions discussed and pt &/or family have verbalized understanding of the instructions. No further questions at time of discharge.    Discharge Medication List as of 03/04/2023 11:27 PM     START taking these medications   Details  methylPREDNISolone (MEDROL DOSEPAK) 4 MG TBPK tablet Please follow directions on Dosepak, Print        Follow Up: Marylen Ponto, MD [address] [place] [address] [phone]     [facility] Emergency Department at [facility] [address] [id number] [place] [address] [phone]       Apoorva Bugay, Canary Brim, MD 03/04/23 2351

## 2023-03-27 ENCOUNTER — Encounter (HOSPITAL_BASED_OUTPATIENT_CLINIC_OR_DEPARTMENT_OTHER): Payer: Self-pay

## 2023-03-27 ENCOUNTER — Emergency Department (HOSPITAL_BASED_OUTPATIENT_CLINIC_OR_DEPARTMENT_OTHER)
Admission: EM | Admit: 2023-03-27 | Discharge: 2023-03-27 | Disposition: A | Discharge status: Home / Self Care | Payer: BC Managed Care – PPO | Attending: Emergency Medicine | Admitting: Emergency Medicine

## 2023-03-27 ENCOUNTER — Emergency Department (HOSPITAL_BASED_OUTPATIENT_CLINIC_OR_DEPARTMENT_OTHER): Payer: BC Managed Care – PPO

## 2023-03-27 ENCOUNTER — Other Ambulatory Visit: Payer: Self-pay

## 2023-03-27 DIAGNOSIS — J45909 Unspecified asthma, uncomplicated: Secondary | ICD-10-CM | POA: Diagnosis not present

## 2023-03-27 DIAGNOSIS — R0602 Shortness of breath: Secondary | ICD-10-CM | POA: Insufficient documentation

## 2023-03-27 DIAGNOSIS — R072 Precordial pain: Secondary | ICD-10-CM | POA: Insufficient documentation

## 2023-03-27 DIAGNOSIS — R079 Chest pain, unspecified: Secondary | ICD-10-CM

## 2023-03-27 LAB — CBC
HCT: 38.9 % (ref 36.0–46.0)
Hemoglobin: 12.9 g/dL (ref 12.0–15.0)
MCH: 29.3 pg (ref 26.0–34.0)
MCHC: 33.2 g/dL (ref 30.0–36.0)
MCV: 88.2 fL (ref 80.0–100.0)
Platelets: 248 10*3/uL (ref 150–400)
RBC: 4.41 MIL/uL (ref 3.87–5.11)
RDW: 12.4 % (ref 11.5–15.5)
WBC: 6.1 10*3/uL (ref 4.0–10.5)
nRBC: 0 % (ref 0.0–0.2)

## 2023-03-27 LAB — BASIC METABOLIC PANEL
Anion gap: 9 (ref 5–15)
BUN: 10 mg/dL (ref 6–20)
CO2: 24 mmol/L (ref 22–32)
Calcium: 8.6 mg/dL — ABNORMAL LOW (ref 8.9–10.3)
Chloride: 104 mmol/L (ref 98–111)
Creatinine, Ser: 0.79 mg/dL (ref 0.44–1.00)
GFR, Estimated: >60 mL/min (ref >60)
Glucose, Bld: 78 mg/dL (ref 70–99)
Potassium: 3.5 mmol/L (ref 3.5–5.1)
Sodium: 137 mmol/L (ref 135–145)

## 2023-03-27 LAB — PREGNANCY, URINE: Preg Test, Ur: NEGATIVE

## 2023-03-27 LAB — D-DIMER, QUANTITATIVE: D-Dimer, Quant: 0.32 ug/mL-FEU (ref 0.00–0.50)

## 2023-03-27 LAB — TROPONIN I (HIGH SENSITIVITY): Troponin I (High Sensitivity): <2 ng/L (ref <18)

## 2023-03-27 MED ORDER — ALUM & MAG HYDROXIDE-SIMETH 200-200-20 MG/5ML PO SUSP
15.0000 mL | Freq: Once | ORAL | Status: AC
  Administered 2023-03-27: 15 mL via ORAL
  Filled 2023-03-27: qty 30

## 2023-03-27 MED ORDER — FAMOTIDINE 20 MG PO TABS: 20.0000 mg | ORAL_TABLET | Freq: Every day | ORAL | 0 refills | Status: AC

## 2023-03-27 MED ORDER — FAMOTIDINE 20 MG PO TABS
20.0000 mg | ORAL_TABLET | Freq: Once | ORAL | Status: AC
  Administered 2023-03-27: 20 mg via ORAL
  Filled 2023-03-27: qty 1

## 2023-03-27 MED ORDER — IPRATROPIUM-ALBUTEROL 0.5-2.5 (3) MG/3ML IN SOLN
3.0000 mL | Freq: Once | RESPIRATORY_TRACT | Status: AC
  Administered 2023-03-27: 3 mL via RESPIRATORY_TRACT
  Filled 2023-03-27: qty 3

## 2023-03-27 MED ORDER — KETOROLAC TROMETHAMINE 15 MG/ML IJ SOLN
15.0000 mg | Freq: Once | INTRAMUSCULAR | Status: AC
  Administered 2023-03-27: 15 mg via INTRAVENOUS
  Filled 2023-03-27: qty 1

## 2023-03-27 NOTE — ED Triage Notes (Signed)
C/o chest & back pain/ tightness starting Sunday. Was given breathing treatment and abx at Urgent Care Monday, states symptoms havent improved. States dyspnea on exertion. Denies cough.  Hx asthma, taking inhaler without relief.

## 2023-03-27 NOTE — ED Provider Notes (Signed)
[place] EMERGENCY DEPARTMENT AT [facility] Provider Note   CSN: [id number] Arrival date & time: 03/27/23  1718     History  Chief Complaint  Patient presents with   Chest Pain    Hailey Hodge is a 27 y.o. female.   Chest Pain   27 year old female presents emergency department with complaints of chest pain, back pain, shortness of breath.  Patient states that symptoms are present since Sunday.  States she was seen on Monday in urgent care and given breathing treatment while in the urgent care and noted improvement of symptoms at that time.  States she was sent home with antibiotic as well as albuterol inhaler open which she states has not been helping her symptoms since then.  Reports shortness of breath with exertion, anterior sternal chest pain which seems to radiate bilaterally lower ribs.  States the pain is slightly worsened with taking a deep breath.  Reports some radiation of pain to thoracic back.  Denies any fever, chills, night sweats, abdominal pain, vomiting, urinary symptoms, vaginal symptoms, change in bowel habits, cough, congestion, sore throat.  Denies any history of DVT/PE, recent surgery/immobilization, known coagulopathy, known malignancy.  Patient does state that she is on OCPs.  Past medical history significant for asthma, headache, thoracic back pain, scoliosis  Home Medications Prior to Admission medications   Medication Sig Start Date End Date Taking? Authorizing Provider  famotidine (PEPCID) 20 MG tablet Take 1 tablet (20 mg total) by mouth daily. 03/27/23  Yes Sherian Maroon A, PA  acetaminophen (TYLENOL) 325 MG tablet Take 2 tablets at onset of headache, may repeat every 4-6 hours as needed.    [provider]  albuterol (PROVENTIL HFA;VENTOLIN HFA) 108 (90 BASE) MCG/ACT inhaler Inhale 2 puffs into the lungs every 6 (six) hours as needed for wheezing.    [provider]  ibuprofen (ADVIL,MOTRIN) 600 MG tablet Take 1 tablet  at onset of migraine. May repeat in 6 hours if needed. 12/02/18   Elveria Rising, NP  meclizine (ANTIVERT) 25 MG tablet Take 1 tablet (25 mg total) by mouth 3 (three) times daily as needed for dizziness. 06/09/22   Arthor Captain, PA-C  methylPREDNISolone (MEDROL DOSEPAK) 4 MG TBPK tablet Please follow directions on Dosepak 03/04/23   Tegeler, Canary Brim, MD  montelukast (SINGULAIR) 10 MG tablet Take 10 mg by mouth.    [provider]  naproxen (NAPROSYN) 375 MG tablet Take 1 tablet (375 mg total) by mouth 2 (two) times daily with a meal. 06/09/22   Arthor Captain, PA-C  norethindrone-ethinyl estradiol (MICROGESTIN,JUNEL,LOESTRIN) 1-20 MG-MCG tablet TK 1 T PO QD 11/30/18   [provider]  Norethindrone-Ethinyl Estradiol-Fe Biphas (LO LOESTRIN FE) 1 MG-10 MCG / 10 MCG tablet TAKE 1 TABLET BY MOUTH DAILY 04/14/16   [provider]      Allergies    Prednisone    Review of Systems   Review of Systems  Cardiovascular:  Positive for chest pain.  All other systems reviewed and are negative.   Physical Exam Updated Vital Signs BP (!) 117/57   Pulse 80   Temp 97.7 F (36.5 C) (Oral)   Resp (!) 22   Ht 5\' 5"  (1.651 m)   Wt 63.5 kg   SpO2 100%   BMI 23.30 kg/m  Physical Exam Vitals and nursing note reviewed.  Constitutional:      General: She is not in acute distress.    Appearance: She is well-developed.  HENT:  Head: Normocephalic and atraumatic.  Eyes:     Conjunctiva/sclera: Conjunctivae normal.  Cardiovascular:     Rate and Rhythm: Regular rhythm. Tachycardia present.     Heart sounds: No murmur heard.    Comments: Tachycardic initially with a heart rate of 102 Pulmonary:     Effort: Pulmonary effort is normal. No respiratory distress.     Breath sounds: Normal breath sounds. No wheezing, rhonchi or rales.     Comments: Sternal tenderness to palpation. Abdominal:     Palpations: Abdomen is soft.     Tenderness: There is no abdominal  tenderness.  Musculoskeletal:        General: No swelling.     Cervical back: Neck supple.     Right lower leg: No edema.     Left lower leg: No edema.  Skin:    General: Skin is warm and dry.     Capillary Refill: Capillary refill takes less than 2 seconds.  Neurological:     Mental Status: She is alert.  Psychiatric:        Mood and Affect: Mood normal.     ED Results / Procedures / Treatments   Labs (all labs ordered are listed, but only abnormal results are displayed) Labs Reviewed  BASIC METABOLIC PANEL - Abnormal; Notable for the following components:      Result Value   Calcium 8.6 (*)    All other components within normal limits  CBC  PREGNANCY, URINE  D-DIMER, QUANTITATIVE  TROPONIN I (HIGH SENSITIVITY)    EKG None  Radiology DG Chest 2 View  Result Date: 03/27/2023 CLINICAL DATA:  Chest pain. EXAM: CHEST - 2 VIEW COMPARISON:  June 23, 2014. FINDINGS: The heart size and mediastinal contours are within normal limits. Both lungs are clear. The visualized skeletal structures are unremarkable. IMPRESSION: No active cardiopulmonary disease. Electronically Signed   By: Lupita Raider M.D.   On: 03/27/2023 18:01    Procedures Procedures    Medications Ordered in ED Medications  ipratropium-albuterol (DUONEB) 0.5-2.5 (3) MG/3ML nebulizer solution 3 mL (3 mLs Nebulization Given 03/27/23 1807)  ketorolac (TORADOL) 15 MG/ML injection 15 mg (15 mg Intravenous Given 03/27/23 1810)  famotidine (PEPCID) tablet 20 mg (20 mg Oral Given 03/27/23 1823)  alum & mag hydroxide-simeth (MAALOX/MYLANTA) 200-200-20 MG/5ML suspension 15 mL (15 mLs Oral Given 03/27/23 1823)    ED Course/ Medical Decision Making/ A&P                             Medical Decision Making Amount and/or Complexity of Data Reviewed Labs: ordered. Radiology: ordered.  Risk Prescription drug management.   This patient presents to the ED for concern of chest pain, shortness of breath, this involves  an extensive number of treatment options, and is a complaint that carries with it a high risk of complications and morbidity.  The differential diagnosis includes ACS, PE, pneumothorax, asthma, COPD, musculoskeletal, GERD, pericarditis/myocarditis/tamponade, aortic dissection   Co morbidities that complicate the patient evaluation  See HPI   Additional history obtained:  Additional history obtained from EMR External records from outside source obtained and reviewed including hospital records   Lab Tests:  I Ordered, and personally interpreted labs.  The pertinent results include: No leukocytosis.  No evidence of anemia.  Platelets within normal range.  No electrolyte abnormalities.  No renal dysfunction.  Troponin less than 2.  D-dimer within normal limits.  Urine pregnancy negative.  Imaging Studies ordered:  I ordered imaging studies including chest x-ray I independently visualized and interpreted imaging which showed no acute cardiopulmonary abnormalities I agree with the radiologist interpretation   Cardiac Monitoring: / EKG:  The patient was maintained on a cardiac monitor.  I personally viewed and interpreted the cardiac monitored which showed an underlying rhythm of: Sinus rhythm without acute ischemic change   Consultations Obtained:  N/a   Problem List / ED Course / Critical interventions / Medication management  Chest pain I ordered medication including Toradol, DuoNeb, Pepcid, Maalox   Reevaluation of the patient after these medicines showed that the patient improved I have reviewed the patients home medicines and have made adjustments as needed   Social Determinants of Health:  Secondhand smoking.  Denies active tobacco use, illicit drug use.   Test / Admission - Considered:  Chest pain Vitals signs significant for initially tachycardic with a heart rate just over 100 of which decreased with time elapsed and medicines administered while in the  emergency department.. Otherwise within normal range and stable throughout visit. Laboratory/imaging studies significant for: See above 27 year old female presents emergency department with complaints of chest pain with radiation to thoracic back.  Patient states that symptoms have been present since "Sunday and been relatively constant since onset.  Patient's workup today overall reassuring.  Patient without evidence of hypertension, pulse deficits or neurologic deficits; low suspicion for aortic dissection.  Patient without obvious acute ischemic changes on EKG, negative troponin; doubt ACS.  Second troponin was not performed given duration of time since symptom onset.  Patient with heart score of 0-3.  Patient with initial tachycardia with a heart rate in the 100s, current OCP use but otherwise, without other risk factors for DVT/PE; D-dimer subsequently pursued of which was negative.  Low suspicion for PE.  Patient noted significant improvement/resolution of symptoms after administration of GI cocktail.  I suspect symptoms are most likely secondary to GERD/esophagitis.  Will recommend in addition to patient's at home pantoprazole, addition of H2 blocker as well as Maalox as needed.  Close follow-up with primary care recommended for reevaluation of symptoms.  Patient also educated regarding dietary changes to prevent exacerbation.  Treatment plan discussed at length with patient and she acknowledged understanding was agreeable to said plan.  Patient overall well-appearing, febrile no acute distress. Worrisome signs and symptoms were discussed with the patient, and the patient acknowledged understanding to return to the ED if noticed. Patient was stable upon discharge.           Final Clinical Impression(s) / ED Diagnoses Final diagnoses:  Chest pain, unspecified type    Rx / DC Orders ED Discharge Orders          Ordered    famotidine (PEPCID) 20 MG tablet  Daily        07" /11/24 1839               Peter Garter, [place] 03/27/23 1845    Arby Barrette, MD 03/27/23 1932

## 2023-03-27 NOTE — ED Notes (Signed)
Ambulated from lobby to room 12, speaking in complete sentences, HR112, SpO2 99%. Last SABA this morning.  03/27/23 1727  Resting  Supplemental oxygen during test? No  Resting Heart Rate 104  Resting Sp02 99  Lap 1 (250 feet)  HR 112  02 Sat 99

## 2023-03-27 NOTE — ED Notes (Signed)
Discharge instructions reviewed with patient. Patient verbalizes understanding, no further questions at this time. Medications/prescriptions and follow up information provided. No acute distress noted at time of departure.  

## 2023-03-27 NOTE — Discharge Instructions (Addendum)
As discussed, I suspect your symptoms are most likely secondary to GERD.  In addition to your pantoprazole, we will famotidine as well as recommend using Maalox over-the-counter as needed.  See information attached your discharge papers regarding food/liquids that makes symptoms worse.  Recommend follow-up with primary care for reassessment of your symptoms.  Please do not hesitate to return to emergency department for worrisome signs and symptoms we discussed to become apparent.

## 2023-11-14 ENCOUNTER — Other Ambulatory Visit: Payer: Self-pay

## 2023-11-14 DIAGNOSIS — E119 Type 2 diabetes mellitus without complications: Secondary | ICD-10-CM

## 2023-11-21 ENCOUNTER — Other Ambulatory Visit: Payer: Self-pay

## 2023-11-22 LAB — COMPREHENSIVE METABOLIC PANEL
AG Ratio: 1.6 (calc) (ref 1.0–2.5)
ALT: 13 U/L (ref 6–29)
AST: 12 U/L (ref 10–30)
Albumin: 4.3 g/dL (ref 3.6–5.1)
Alkaline phosphatase (APISO): 55 U/L (ref 31–125)
BUN: 12 mg/dL (ref 7–25)
CO2: 27 mmol/L (ref 20–32)
Calcium: 9 mg/dL (ref 8.6–10.2)
Chloride: 104 mmol/L (ref 98–110)
Creat: 0.69 mg/dL (ref 0.50–0.96)
Globulin: 2.7 g/dL (ref 1.9–3.7)
Glucose, Bld: 84 mg/dL (ref 65–99)
Potassium: 4.3 mmol/L (ref 3.5–5.3)
Sodium: 137 mmol/L (ref 135–146)
Total Bilirubin: 0.4 mg/dL (ref 0.2–1.2)
Total Protein: 7 g/dL (ref 6.1–8.1)

## 2023-11-22 LAB — LIPID PANEL
Cholesterol: 195 mg/dL (ref <200)
HDL: 39 mg/dL — ABNORMAL LOW (ref >=50)
LDL Cholesterol (Calc): 131 mg/dL — ABNORMAL HIGH
Non-HDL Cholesterol (Calc): 156 mg/dL — ABNORMAL HIGH (ref <130)
Total CHOL/HDL Ratio: 5 (calc) — ABNORMAL HIGH (ref <5.0)
Triglycerides: 140 mg/dL (ref <150)

## 2023-11-22 LAB — C-PEPTIDE: C-Peptide: 2.54 ng/mL (ref 0.80–3.85)

## 2023-11-22 LAB — MICROALBUMIN / CREATININE URINE RATIO
Creatinine, Urine: 48 mg/dL (ref 20–275)
Microalb Creat Ratio: 6 mg/g{creat} (ref <30)
Microalb, Ur: 0.3 mg/dL

## 2023-11-22 LAB — HEMOGLOBIN A1C
Hgb A1c MFr Bld: 5.1 %{Hb} (ref <5.7)
Mean Plasma Glucose: 100 mg/dL
eAG (mmol/L): 5.5 mmol/L

## 2023-11-26 ENCOUNTER — Encounter: Payer: Self-pay | Admitting: "Endocrinology

## 2023-11-26 ENCOUNTER — Ambulatory Visit: Payer: Self-pay | Admitting: "Endocrinology

## 2023-11-26 VITALS — BP 100/80 | HR 97 | Ht 65.0 in | Wt 154.0 lb

## 2023-11-26 DIAGNOSIS — E663 Overweight: Secondary | ICD-10-CM | POA: Diagnosis not present

## 2023-11-26 DIAGNOSIS — E7800 Pure hypercholesterolemia, unspecified: Secondary | ICD-10-CM

## 2023-11-26 NOTE — Progress Notes (Signed)
 Outpatient Endocrinology Note [facility] [place], MD    Hailey Hodge Jul 31, 1996 [id number]  Referring Provider: Marylen Ponto, MD Primary Care Provider: Marylen Ponto, MD Reason for consultation: Subjective   Assessment & Plan  Diagnoses and all orders for this visit:  Overweight  Pure hypercholesterolemia   Pt interested in weight loss Discussed lifestyle changes Not interested in medications not indicated at this BMI Maintain healthy lifestyle including 1200 Cal/day, 30 min of activity/day, avoiding refined/processed/outside food 20 minutes physical activity per day, in continuum or interruptedly through the day  Goals: less than 60 grams of carbohydrate/meal, 1200-1500 Cal/day, 10,0000 steps a day and weight loss of 0.5-1 lb/ wk  Sleep 7-9 hours/day, adapt good sleep hygiene Adapt de-stressing and relaxation techniques to prevent stress induced weight gain Avoid/switch medications that lead to weight gain by discussing with the prescribing physician   Counseled extensively on gradual exercise escalation  Patient understands and agreed   Return in about 3 months (around 02/26/2024).   I have reviewed current medications, nurse's notes, allergies, vital signs, past medical and surgical history, family medical history, and social history for this encounter. Counseled patient on symptoms, examination findings, lab findings, imaging results, treatment decisions and monitoring and prognosis. The patient understood the recommendations and agrees with the treatment plan. All questions regarding treatment plan were fully answered.  Hailey [place], MD  11/26/23   History of Present Illness HPI  Hailey Hodge is a 28 y.o. year old female who presents for evaluation of "insulin resistance".  Reports gaining 30 lbs in past year without change in lifestyle  Reports excess stress in there past 2 years Started anxiety medication 1-2 years ago: Citalopram   Sleeps well,  at least 7 hours   Reports HR going to 180s during work out    Physical Exam  BP 100/80   Pulse 97   Ht 5\' 5"  (1.651 m)   Wt 154 lb (69.9 kg)   SpO2 99%   BMI 25.63 kg/m    Constitutional: well developed, well nourished Head: normocephalic, atraumatic Eyes: sclera anicteric, no redness Neck: supple Lungs: normal respiratory effort Neurology: alert and oriented Skin: dry, no appreciable rashes Musculoskeletal: no appreciable defects Psychiatric: normal mood and affect   Current Medications Patient's Medications  New Prescriptions   No medications on file  Previous Medications   ACETAMINOPHEN (TYLENOL) 325 MG TABLET    Take 2 tablets at onset of headache, may repeat every 4-6 hours as needed.   ALBUTEROL (PROVENTIL HFA;VENTOLIN HFA) 108 (90 BASE) MCG/ACT INHALER    Inhale 2 puffs into the lungs every 6 (six) hours as needed for wheezing.   FAMOTIDINE (PEPCID) 20 MG TABLET    Take 1 tablet (20 mg total) by mouth daily.   IBUPROFEN (ADVIL,MOTRIN) 600 MG TABLET    Take 1 tablet at onset of migraine. May repeat in 6 hours if needed.   MECLIZINE (ANTIVERT) 25 MG TABLET    Take 1 tablet (25 mg total) by mouth 3 (three) times daily as needed for dizziness.   METHYLPREDNISOLONE (MEDROL DOSEPAK) 4 MG TBPK TABLET    Please follow directions on Dosepak   MONTELUKAST (SINGULAIR) 10 MG TABLET    Take 10 mg by mouth.   NAPROXEN (NAPROSYN) 375 MG TABLET    Take 1 tablet (375 mg total) by mouth 2 (two) times daily with a meal.   NORETHINDRONE-ETHINYL ESTRADIOL (MICROGESTIN,JUNEL,LOESTRIN) 1-20 MG-MCG TABLET    TK 1 T PO QD  NORETHINDRONE-ETHINYL ESTRADIOL-FE BIPHAS (LO LOESTRIN FE) 1 MG-10 MCG / 10 MCG TABLET    TAKE 1 TABLET BY MOUTH DAILY   PANTOPRAZOLE (PROTONIX) 20 MG TABLET      Modified Medications   No medications on file  Discontinued Medications   No medications on file    Allergies Allergies  Allergen Reactions   Prednisone     Other reaction(s): Other (See Comments) "my  muscles tightened up"    Past Medical History Past Medical History:  Diagnosis Date   Asthma May 2013   Headache(784.0)     Past Surgical History Past Surgical History:  Procedure Laterality Date   SALPINGOOPHORECTOMY  August 29, 1998    Family History family history includes Anxiety disorder in her mother; Bipolar disorder in her maternal grandmother; Colon cancer in her maternal grandfather; Depression in her maternal grandmother; Migraines in her maternal grandmother and mother; Stroke in her paternal grandfather.  Social History Social History   Socioeconomic History   Marital status: Single    Spouse name: Not on file   Number of children: Not on file   Years of education: Not on file   Highest education level: Not on file  Occupational History   Not on file  Tobacco Use   Smoking status: Passive Smoke Exposure - Never Smoker   Smokeless tobacco: Never  Vaping Use   Vaping status: Never Used  Substance and Sexual Activity   Alcohol use: Yes   Drug use: No   Sexual activity: Never  Other Topics Concern   Not on file  Social History Narrative   Hailey Hodge graduated from [employer].    She is a Holiday representative at [employer].   She lives with her mom and her sister.   Hailey Hodge enjoys homework, TV, social media, book clubs, key club, music and dance.   Working at [employer]    Social Drivers of [place] Strain: Not on file  Food Insecurity: Not on file  Transportation Needs: Not on file  Physical Activity: Not on file  Stress: Not on file  Social Connections: Not on file  Intimate Partner Violence: Not on file    Lab Results  Component Value Date   CHOL 195 11/21/2023   Lab Results  Component Value Date   HDL 39 (L) 11/21/2023   Lab Results  Component Value Date   LDLCALC 131 (H) 11/21/2023   Lab Results  Component Value Date   TRIG 140 11/21/2023   Lab Results  Component Value Date   CHOLHDL 5.0 (H) 11/21/2023   Lab Results   Component Value Date   CREATININE 0.69 11/21/2023   No results found for: "GFR"    Component Value Date/Time   NA 137 11/21/2023 0803   K 4.3 11/21/2023 0803   CL 104 11/21/2023 0803   CO2 27 11/21/2023 0803   GLUCOSE 84 11/21/2023 0803   BUN 12 11/21/2023 0803   CREATININE 0.69 11/21/2023 0803   CALCIUM 9.0 11/21/2023 0803   PROT 7.0 11/21/2023 0803   ALBUMIN 3.5 06/23/2014 0056   AST 12 11/21/2023 0803   ALT 13 11/21/2023 0803   ALKPHOS 65 06/23/2014 0056   BILITOT 0.4 11/21/2023 0803   GFRNONAA >60 03/27/2023 1739   GFRAA NOT CALCULATED 06/23/2014 0056      Latest Ref Rng & Units 11/21/2023    8:03 AM 03/27/2023    5:39 PM 06/08/2022   10:32 PM  BMP  Glucose 65 - 99 mg/dL  84  78  96   BUN 7 - 25 mg/dL 12  10  11    Creatinine 0.50 - 0.96 mg/dL 1.30  8.65  7.84   BUN/Creat Ratio 6 - 22 (calc) SEE NOTE:     Sodium 135 - 146 mmol/L 137  137  137   Potassium 3.5 - 5.3 mmol/L 4.3  3.5  3.6   Chloride 98 - 110 mmol/L 104  104  109   CO2 20 - 32 mmol/L 27  24  24    Calcium 8.6 - 10.2 mg/dL 9.0  8.6  8.1        Component Value Date/Time   WBC 6.1 03/27/2023 1739   RBC 4.41 03/27/2023 1739   HGB 12.9 03/27/2023 1739   HCT 38.9 03/27/2023 1739   PLT 248 03/27/2023 1739   MCV 88.2 03/27/2023 1739   MCH 29.3 03/27/2023 1739   MCHC 33.2 03/27/2023 1739   RDW 12.4 03/27/2023 1739   LYMPHSABS 2.4 06/08/2022 2232   MONOABS 0.3 06/08/2022 2232   EOSABS 0.1 06/08/2022 2232   BASOSABS 0.0 06/08/2022 2232   No results found for: "TSH", "FREET4"       Parts of this note may have been dictated using voice recognition software. There may be variances in spelling and vocabulary which are unintentional. Not all errors are proofread. Please notify the There[facility] Parkin if any discrepancies are noted or if the meaning of any statement is not clear.

## 2023-11-30 ENCOUNTER — Emergency Department (HOSPITAL_BASED_OUTPATIENT_CLINIC_OR_DEPARTMENT_OTHER)

## 2023-11-30 ENCOUNTER — Encounter (HOSPITAL_BASED_OUTPATIENT_CLINIC_OR_DEPARTMENT_OTHER): Payer: Self-pay | Admitting: Emergency Medicine

## 2023-11-30 ENCOUNTER — Other Ambulatory Visit: Payer: Self-pay

## 2023-11-30 ENCOUNTER — Emergency Department (HOSPITAL_BASED_OUTPATIENT_CLINIC_OR_DEPARTMENT_OTHER)
Admission: EM | Admit: 2023-11-30 | Discharge: 2023-11-30 | Disposition: A | Discharge status: Home / Self Care | Attending: Emergency Medicine | Admitting: Emergency Medicine

## 2023-11-30 DIAGNOSIS — J45909 Unspecified asthma, uncomplicated: Secondary | ICD-10-CM | POA: Diagnosis not present

## 2023-11-30 DIAGNOSIS — R0789 Other chest pain: Secondary | ICD-10-CM | POA: Diagnosis not present

## 2023-11-30 DIAGNOSIS — R079 Chest pain, unspecified: Secondary | ICD-10-CM | POA: Diagnosis present

## 2023-11-30 HISTORY — DX: Gastro-esophageal reflux disease without esophagitis: K21.9

## 2023-11-30 LAB — CBC
HCT: 36.7 % (ref 36.0–46.0)
Hemoglobin: 12.4 g/dL (ref 12.0–15.0)
MCH: 29.8 pg (ref 26.0–34.0)
MCHC: 33.8 g/dL (ref 30.0–36.0)
MCV: 88.2 fL (ref 80.0–100.0)
Platelets: 279 10*3/uL (ref 150–400)
RBC: 4.16 MIL/uL (ref 3.87–5.11)
RDW: 12.2 % (ref 11.5–15.5)
WBC: 6.3 10*3/uL (ref 4.0–10.5)
nRBC: 0 % (ref 0.0–0.2)

## 2023-11-30 LAB — BASIC METABOLIC PANEL
Anion gap: 8 (ref 5–15)
BUN: 11 mg/dL (ref 6–20)
CO2: 23 mmol/L (ref 22–32)
Calcium: 8.7 mg/dL — ABNORMAL LOW (ref 8.9–10.3)
Chloride: 106 mmol/L (ref 98–111)
Creatinine, Ser: 0.74 mg/dL (ref 0.44–1.00)
GFR, Estimated: >60 mL/min (ref >60)
Glucose, Bld: 98 mg/dL (ref 70–99)
Potassium: 3.7 mmol/L (ref 3.5–5.1)
Sodium: 137 mmol/L (ref 135–145)

## 2023-11-30 LAB — TROPONIN I (HIGH SENSITIVITY): Troponin I (High Sensitivity): <2 ng/L (ref <18)

## 2023-11-30 LAB — PREGNANCY, URINE: Preg Test, Ur: NEGATIVE

## 2023-11-30 LAB — D-DIMER, QUANTITATIVE: D-Dimer, Quant: <0.27 ug{FEU}/mL (ref 0.00–0.50)

## 2023-11-30 MED ORDER — LACTATED RINGERS IV BOLUS
1000.0000 mL | Freq: Once | INTRAVENOUS | Status: AC
  Administered 2023-11-30: 1000 mL via INTRAVENOUS

## 2023-11-30 NOTE — ED Notes (Signed)
 Laid flat for orthostatic vitals at 21:36

## 2023-11-30 NOTE — ED Notes (Signed)
Pt lying flat for orthostatic VS 

## 2023-11-30 NOTE — ED Provider Notes (Signed)
 [place] [facility] Provider Note   CSN: [id number] Arrival date & time: 11/30/23  1741     History {Add pertinent medical, surgical, social history, OB history to HPI:1} Chief Complaint  Patient presents with   Chest Pain    Hailey Hodge is a 28 y.o. female.   Chest Pain      Home Medications Prior to Admission medications   Medication Sig Start Date End Date Taking? Authorizing Provider  acetaminophen (TYLENOL) 325 MG tablet Take 2 tablets at onset of headache, may repeat every 4-6 hours as needed.    [provider]  albuterol (PROVENTIL HFA;VENTOLIN HFA) 108 (90 BASE) MCG/ACT inhaler Inhale 2 puffs into the lungs every 6 (six) hours as needed for wheezing.    [provider]  famotidine (PEPCID) 20 MG tablet Take 1 tablet (20 mg total) by mouth daily. 03/27/23   Peter Garter, PA  ibuprofen (ADVIL,MOTRIN) 600 MG tablet Take 1 tablet at onset of migraine. May repeat in 6 hours if needed. 12/02/18   Elveria Rising, NP  meclizine (ANTIVERT) 25 MG tablet Take 1 tablet (25 mg total) by mouth 3 (three) times daily as needed for dizziness. 06/09/22   Arthor Captain, PA-C  methylPREDNISolone (MEDROL DOSEPAK) 4 MG TBPK tablet Please follow directions on Dosepak 03/04/23   Tegeler, Canary Brim, MD  montelukast (SINGULAIR) 10 MG tablet Take 10 mg by mouth.    [provider]  naproxen (NAPROSYN) 375 MG tablet Take 1 tablet (375 mg total) by mouth 2 (two) times daily with a meal. 06/09/22   Arthor Captain, PA-C  norethindrone-ethinyl estradiol (MICROGESTIN,JUNEL,LOESTRIN) 1-20 MG-MCG tablet TK 1 T PO QD 11/30/18   [provider]  Norethindrone-Ethinyl Estradiol-Fe Biphas (LO LOESTRIN FE) 1 MG-10 MCG / 10 MCG tablet TAKE 1 TABLET BY MOUTH DAILY 04/14/16   [provider]  pantoprazole (PROTONIX) 20 MG tablet  04/16/23   [provider]      Allergies    Prednisone    Review of Systems    Review of Systems  Cardiovascular:  Positive for chest pain.    Physical Exam Updated Vital Signs BP 127/81 (BP Location: Left Arm)   Pulse 89   Temp 99.2 F (37.3 C)   Resp 18   Ht 5\' 5"  (1.651 m)   Wt 70.3 kg   SpO2 100%   BMI 25.79 kg/m  Physical Exam  ED Results / Procedures / Treatments   Labs (all labs ordered are listed, but only abnormal results are displayed) Labs Reviewed  BASIC METABOLIC PANEL - Abnormal; Notable for the following components:      Result Value   Calcium 8.7 (*)    All other components within normal limits  CBC  PREGNANCY, URINE  D-DIMER, QUANTITATIVE  TROPONIN I (HIGH SENSITIVITY)  TROPONIN I (HIGH SENSITIVITY)    EKG None  Radiology DG Chest 2 View Result Date: 11/30/2023 CLINICAL DATA:  Chest pain EXAM: CHEST - 2 VIEW COMPARISON:  11/18/2023 FINDINGS: The heart size and mediastinal contours are within normal limits. Both lungs are clear. The visualized skeletal structures are unremarkable. No pneumothorax. IMPRESSION: Normal study. Electronically Signed   By: Charlett Nose M.D.   On: 11/30/2023 21:01    Procedures Procedures  {Document cardiac monitor, telemetry assessment procedure when appropriate:1}  Medications Ordered in ED Medications  lactated ringers bolus 1,000 mL (1,000 mLs Intravenous New Bag/Given 11/30/23 2134)    ED Course/ Medical Decision Making/ A&P   {  Click here for ABCD2, HEART and other calculatorsREFRESH Note before signing :1}                              Medical Decision Making Amount and/or Complexity of Data Reviewed Labs: ordered. Radiology: ordered.   ***  {Document critical care time when appropriate:1} {Document review of labs and clinical decision tools ie heart score, Chads2Vasc2 etc:1}  {Document your independent review of radiology images, and any outside records:1} {Document your discussion with family members, caretakers, and with consultants:1} {Document social determinants of  health affecting pt's care:1} {Document your decision making why or why not admission, treatments were needed:1} Final Clinical Impression(s) / ED Diagnoses Final diagnoses:  None    Rx / DC Orders ED Discharge Orders     None

## 2023-11-30 NOTE — Discharge Instructions (Addendum)
 You were seen in the emerged from today for evaluation of your chest pain.  Your workup is unremarkable.  Your calcium is only mildly decreased.  He can follow-up with your primary care doctor regarding this.  I have included an ambulatory referral to cardiologist.  They should reach out to you the next few days schedule an appointment.  If they have not, please make sure you call to schedule.  I have also included the information for the [facility] primary care office in [facility].  You can also call to schedule appointment with them as well.  Please make sure you are staying well-hydrated, drinking plenty of fluids, mainly water.  Make sure that you are eating regular meals throughout the day as well.  If you have any concerns, new or worsening symptoms, please return to your nearest emergency department for reevaluation.  Contact a doctor if: Your chest pain does not go away. You feel depressed. You have a fever. Get help right away if: Your chest pain is worse. You have a cough that gets worse, or you cough up blood. You have very bad (severe) pain in your belly (abdomen). You pass out (faint). You have either of these for no clear reason: Sudden chest discomfort. Sudden discomfort in your arms, back, neck, or jaw. You have shortness of breath at any time. You suddenly start to sweat, or your skin gets clammy. You feel sick to your stomach (nauseous). You throw up (vomit). You suddenly feel lightheaded or dizzy. You feel very weak or tired. Your heart starts to beat fast, or it feels like it is skipping beats. These symptoms may be an emergency. Do not wait to see if the symptoms will go away. Get medical help right away. Call your local emergency services (911 in the [place] Do not drive yourself to the hospital.

## 2023-11-30 NOTE — ED Triage Notes (Addendum)
 Patient reports intermittent tachycardia x 1 week, with constant chest pain that started yesterday. States tachycardia and chest pain worsens with standing. Currently being evaluated by pcp for POTS.

## 2023-12-03 ENCOUNTER — Other Ambulatory Visit: Payer: Self-pay

## 2023-12-03 DIAGNOSIS — K219 Gastro-esophageal reflux disease without esophagitis: Secondary | ICD-10-CM | POA: Insufficient documentation

## 2023-12-03 DIAGNOSIS — R079 Chest pain, unspecified: Secondary | ICD-10-CM | POA: Insufficient documentation

## 2023-12-04 ENCOUNTER — Ambulatory Visit

## 2023-12-04 VITALS — BP 110/70 | HR 76 | Ht 65.0 in | Wt 154.8 lb

## 2023-12-04 DIAGNOSIS — R002 Palpitations: Secondary | ICD-10-CM | POA: Diagnosis not present

## 2023-12-04 NOTE — Patient Instructions (Signed)
 Medication Instructions:  Your physician recommends that you continue on your current medications as directed. Please refer to the Current Medication list given to you today.  *If you need a refill on your cardiac medications before your next appointment, please call your pharmacy*   Lab Work: None Ordered If you have labs (blood work) drawn today and your tests are completely normal, you will receive your results only by: MyChart Message (if you have MyChart) OR A paper copy in the mail If you have any lab test that is abnormal or we need to change your treatment, we will call you to review the results.   Testing/Procedures: Echocardiogram An echocardiogram is a test that uses sound waves (ultrasound) to produce images of the heart. Images from an echocardiogram can provide important information about: Heart size and shape. The size and thickness and movement of your heart's walls. Heart muscle function and strength. Heart valve function or if you have stenosis. Stenosis is when the heart valves are too narrow. If blood is flowing backward through the heart valves (regurgitation). A tumor or infectious growth around the heart valves. Areas of heart muscle that are not working well because of poor blood flow or injury from a heart attack. Aneurysm detection. An aneurysm is a weak or damaged part of an artery wall. The wall bulges out from the normal force of blood pumping through the body. Tell a health care provider about: Any allergies you have. All medicines you are taking, including vitamins, herbs, eye drops, creams, and over-the-counter medicines. Any blood disorders you have. Any surgeries you have had. Any medical conditions you have. Whether you are pregnant or may be pregnant. What are the risks? Generally, this is a safe test. However, problems may occur, including an allergic reaction to dye (contrast) that may be used during the test. What happens before the test? No  specific preparation is needed. You may eat and drink normally. What happens during the test?  You will take off your clothes from the waist up and put on a hospital gown. Electrodes or electrocardiogram (ECG)patches may be placed on your chest. The electrodes or patches are then connected to a device that monitors your heart rate and rhythm. You will lie down on a table for an ultrasound exam. A gel will be applied to your chest to help sound waves pass through your skin. A handheld device, called a transducer, will be pressed against your chest and moved over your heart. The transducer produces sound waves that travel to your heart and bounce back (or "echo" back) to the transducer. These sound waves will be captured in real-time and changed into images of your heart that can be viewed on a video monitor. The images will be recorded on a computer and reviewed by your health care provider. You may be asked to change positions or hold your breath for a short time. This makes it easier to get different views or better views of your heart. In some cases, you may receive contrast through an IV in one of your veins. This can improve the quality of the pictures from your heart. The procedure may vary among health care providers and hospitals. What can I expect after the test? You may return to your normal, everyday life, including diet, activities, and medicines, unless your health care provider tells you not to do that. Follow these instructions at home: It is up to you to get the results of your test. Ask your health care provider,  or the department that is doing the test, when your results will be ready. Keep all follow-up visits. This is important. Summary An echocardiogram is a test that uses sound waves (ultrasound) to produce images of the heart. Images from an echocardiogram can provide important information about the size and shape of your heart, heart muscle function, heart valve function, and  other possible heart problems. You do not need to do anything to prepare before this test. You may eat and drink normally. After the echocardiogram is completed, you may return to your normal, everyday life, unless your health care provider tells you not to do that. This information is not intended to replace advice given to you by your health care provider. Make sure you discuss any questions you have with your health care provider. Document Revised: 05/16/2021 Document Reviewed: 04/25/2020 Elsevier Patient Education  2023 Elsevier Inc.    Treadmill Stress Test Instructions:    1. You may take all of your medications  2. No food, drink or tobacco products 2 hours prior to your test.  3. Dress prepared to exercise. Best to wear 2 piece outfit and tennis shoes. Shoes must be closed toe.  4. Please bring all current prescription medications.   Follow-Up: At [facility], you and your health needs are our priority.  As part of our continuing mission to provide you with exceptional heart care, we have created designated Provider Care Teams.  These Care Teams include your primary Cardiologist (physician) and Advanced Practice Providers (APPs -  Physician Assistants and Nurse Practitioners) who all work together to provide you with the care you need, when you need it.  We recommend signing up for the patient portal called "MyChart".  Sign up information is provided on this After Visit Summary.  MyChart is used to connect with patients for Virtual Visits (Telemedicine).  Patients are able to view lab/test results, encounter notes, upcoming appointments, etc.  Non-urgent messages can be sent to your provider as well.   To learn more about what you can do with MyChart, go to [email or web address].    Your next appointment:   Based on test results

## 2023-12-04 NOTE — Progress Notes (Signed)
 Cardiology Consultation:    Date:  12/04/2023   ID:  Hailey Hodge, DOB 02-12-1996, MRN [id number]  PCP:  Marylen Ponto, MD  Cardiologist:  Marlyn Corporal Izabel Chim, MD   Referring MD: Achille Rich, PA-C   No chief complaint on file.    ASSESSMENT AND PLAN:   This child's 28 year old woman with history of migraine, anxiety, asthma and on oral contraceptives, no significant cardiac health issues in the past presents for further evaluation of palpitations and faster heart rates with minimal activity.  No significant abnormalities on blood work, EKG. Symptoms likely related to deconditioning.  Problem List Items Addressed This Visit     Palpitations - Primary   Symptoms with elevated heart rate with minimal activity.  Likely related to deconditioning.  No significant abnormalities on blood work related to thyroid function, blood counts, kidney function, electrolyte abnormalities. No significant EKG abnormalities. No significant abnormalities other than sinus tachycardia at times on her smart watch device recordings of EKG.  Will obtain a formal treadmill EKG stress test to assess exercise capacity, heart rate response and blood pressure response. Will obtain transthoracic echocardiogram to rule out any significant cardiac structural and functional abnormalities. Reassured her overall about no significant abnormalities. Advised her to keep herself well-hydrated drink plenty of fluids and small meal portion sizes and snack at frequent intervals and avoid heavy meals..  Advised to cut down on soda consumption.        Relevant Orders   EKG 12-Lead (Completed)   Exercise Tolerance Test   ECHOCARDIOGRAM COMPLETE   Return to clinic based on test results.   History of Present Illness:    Hailey Hodge is a 28 y.o. female who is being seen today for the evaluation of chest pain with palpitation at the request of Achille Rich, [place].   Pleasant woman here for the visit  by herself.  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  History of migraine, anxiety, asthma, on oral contraceptives. No significant prior cardiac history.  Recently had a visit to the emergency room at 11/30/2023 for symptoms of palpitations and chest pain associated with elevated heart rates.  Was hemodynamically stable with normal blood pressures 127/81 mmHg, pulse rate 89/min.Blood work in the ER with D-dimer was unremarkable, her urine pregnancy test was negative.  High-sensitivity troponin I was less than 2.  CBC unremarkable with hemoglobin 12.4, hematocrit 36.7, BMP with normal BUN 11, creatinine 0.74 and no significant electrolyte abnormalities, calcium was low normal 8.7.  Recent globin A1c from March 7 was normal 5.1.  And lipid panel from March 7 noted total cholesterol 195, HDL 39, LDL 131, triglycerides 140.  TSH levels were normal 0.935 in January 2025.  She reports symptoms where she feels her heart rate speeds up with activities more than expected.  She does not routinely exercise.  With weight gain over the past year by 20 to 30 pounds she attempted exercising this January at the gym.  No after her heart rate speed up to 180 to 190 bpm with mild to moderate intensity exercise on the stepper or treadmill.  Denies any chest pain, lightheadedness during the exertion.  At times randomly she feels her heart rate escalates with change in position.  Settles down as she sits down.  She had her Apple smart watch and tracks her heart rate and rhythm regularly.  Multiple tracings available on her phone reviewed.  Shows normal sinus rhythm and at times sinus tachycardia associated with symptoms of  chest pain and dizziness.  1 recording labeled as A-fib was in fact sinus tachycardia as reviewed.  Secondhand exposure to smoke from parents in childhood. Denies alcohol, smoking, drug use. Drinks 1 bottle of soda a day.  Mentions blood pressures at home typically systolic low 100s to 110s.  Family  history older sibling without any significant health issues. Parents with history of hypertension.  Grandmother with history of heart failure in her 30s.  EKG in the clinic today shows sinus rhythm heart rate 76/min, PR interval 104 ms, QRS duration 70 ms, normal QTc 402 ms.  No delta wave.  Rightward axis likely normal variant prior EKG at the ER visit recently from 11-30-2023 with sinus rhythm heart rate 88/min and no other significant changes.  Past Medical History:  Diagnosis Date   Asthma 01/15/2012   Cervicalgia 01/29/2013   Chronic tension headaches 08/16/2013   GERD (gastroesophageal reflux disease)    Midline thoracic back pain 11/29/2014   Migraine with aura 01/29/2013   Migraine without aura 01/29/2013   Muscular neck pain on left side 05/30/2015   Nonspecific chest pain 12/03/2023   Personal history of scoliosis 11/29/2014    Past Surgical History:  Procedure Laterality Date   SALPINGOOPHORECTOMY  07-09-98    Current Medications: Current Meds  Medication Sig   albuterol (PROVENTIL HFA;VENTOLIN HFA) 108 (90 BASE) MCG/ACT inhaler Inhale 2 puffs into the lungs every 6 (six) hours as needed for wheezing.   citalopram (CELEXA) 10 MG tablet Take 10 mg by mouth daily.   famotidine (PEPCID) 20 MG tablet Take 1 tablet (20 mg total) by mouth daily.   montelukast (SINGULAIR) 10 MG tablet Take 10 mg by mouth at bedtime.   naproxen (NAPROSYN) 375 MG tablet Take 1 tablet (375 mg total) by mouth 2 (two) times daily with a meal.   norethindrone-ethinyl estradiol (MICROGESTIN,JUNEL,LOESTRIN) 1-20 MG-MCG tablet Take 1 tablet by mouth daily.   pantoprazole (PROTONIX) 20 MG tablet Take 20 mg by mouth daily.     Allergies:   Prednisone   Social History   Socioeconomic History   Marital status: Single    Spouse name: Not on file   Number of children: Not on file   Years of education: Not on file   Highest education level: Not on file  Occupational History   Not on file   Tobacco Use   Smoking status: Passive Smoke Exposure - Never Smoker   Smokeless tobacco: Never  Vaping Use   Vaping status: Never Used  Substance and Sexual Activity   Alcohol use: Yes   Drug use: No   Sexual activity: Never  Other Topics Concern   Not on file  Social History Narrative   Acquanetta graduated from [employer].    She is a Holiday representative at [employer].   She lives with her mom and her sister.   Atiya enjoys homework, TV, social media, book clubs, key club, music and dance.   Working at [employer]: Not on [employer] Insecurity: Not on file  Transportation Needs: Not on file  Physical Activity: Not on file  Stress: Not on file  Social Connections: Not on file     Family History: The patient's family history includes Anxiety disorder in her mother; Bipolar disorder in her maternal grandmother; Colon cancer in her maternal grandfather; Depression in her maternal grandmother; Migraines in her maternal grandmother and mother; Stroke in her  paternal grandfather. ROS:   Please see the history of present illness.    All 14 point review of systems negative except as described per history of present illness.  EKGs/Labs/Other Studies Reviewed:    The following studies were reviewed today:   EKG:  EKG Interpretation Date/Time:  Thursday December 04 2023 08:41:13 EDT Ventricular Rate:  76 PR Interval:  104 QRS Duration:  70 QT Interval:  358 QTC Calculation: 402 R Axis:   107  Text Interpretation: Sinus rhythm with short PR Rightward axis Septal infarct , age undetermined Abnormal ECG When compared with ECG of 30-Nov-2023 17:52, PREVIOUS ECG IS PRESENT Confirmed by Huntley Dec reddy [phone]) on 12/04/2023 8:46:54 AM    Recent Labs: 11/21/2023: ALT 13 11/30/2023: BUN 11; Creatinine, Ser 0.74; Hemoglobin 12.4; Platelets 279; Potassium 3.7; Sodium 137  Recent Lipid Panel    Component Value Date/Time   CHOL 195  11/21/2023 0803   TRIG 140 11/21/2023 0803   HDL 39 (L) 11/21/2023 0803   CHOLHDL 5.0 (H) 11/21/2023 0803   LDLCALC 131 (H) 11/21/2023 0803    Physical Exam:    VS:  BP 110/70   Pulse 76   Ht 5\' 5"  (1.651 m)   Wt 154 lb 12.8 oz (70.2 kg)   SpO2 99%   BMI 25.76 kg/m     Wt Readings from Last 3 Encounters:  12/04/23 154 lb 12.8 oz (70.2 kg)  11/30/23 155 lb (70.3 kg)  11/26/23 154 lb (69.9 kg)     GENERAL:  Well nourished, well developed in no acute distress NECK: No JVD; No carotid bruits CARDIAC: RRR, S1 and S2 present, no murmurs, no rubs, no gallops CHEST:  Clear to auscultation without rales, wheezing or rhonchi  Extremities: No pitting pedal edema. Pulses bilaterally symmetric with radial 2+ and dorsalis pedis 2+ NEUROLOGIC:  Alert and oriented x 3  Medication Adjustments/Labs and Tests Ordered: Current medicines are reviewed at length with the patient today.  Concerns regarding medicines are outlined above.  Orders Placed This Encounter  Procedures   Exercise Tolerance Test   EKG 12-Lead   ECHOCARDIOGRAM COMPLETE   No orders of the defined types were placed in this encounter.   Signed, Cecille Amsterdam, MD, MPH, [facility]. 12/04/2023 9:19 AM    [facility]

## 2023-12-04 NOTE — Assessment & Plan Note (Signed)
 Symptoms with elevated heart rate with minimal activity.  Likely related to deconditioning.  No significant abnormalities on blood work related to thyroid function, blood counts, kidney function, electrolyte abnormalities. No significant EKG abnormalities. No significant abnormalities other than sinus tachycardia at times on her smart watch device recordings of EKG.  Will obtain a formal treadmill EKG stress test to assess exercise capacity, heart rate response and blood pressure response. Will obtain transthoracic echocardiogram to rule out any significant cardiac structural and functional abnormalities. Reassured her overall about no significant abnormalities. Advised her to keep herself well-hydrated drink plenty of fluids and small meal portion sizes and snack at frequent intervals and avoid heavy meals..  Advised to cut down on soda consumption.

## 2023-12-17 ENCOUNTER — Telehealth (HOSPITAL_COMMUNITY): Payer: Self-pay | Admitting: *Deleted

## 2023-12-17 NOTE — Telephone Encounter (Signed)
 Left a reminder call for upcoming GXT on 12/24/23 at 8:30.

## 2023-12-24 ENCOUNTER — Ambulatory Visit

## 2023-12-24 DIAGNOSIS — R002 Palpitations: Secondary | ICD-10-CM

## 2023-12-25 LAB — EXERCISE TOLERANCE TEST
Angina Index: 1
Base ST Depression (mm): 0 mm
Duke Treadmill Score: 6
Estimated workload: 11.8
Exercise duration (min): 9 min
Exercise duration (sec): 31 s
MPHR: 195 {beats}/min
Peak HR: 203 {beats}/min
Percent HR: 104 %
Rest HR: 82 {beats}/min
ST Depression (mm): 0 mm

## 2024-01-16 ENCOUNTER — Ambulatory Visit: Admitting: Cardiology

## 2024-01-19 ENCOUNTER — Ambulatory Visit: Admitting: Family Medicine

## 2024-01-22 ENCOUNTER — Ambulatory Visit

## 2024-01-22 DIAGNOSIS — R002 Palpitations: Secondary | ICD-10-CM

## 2024-01-22 LAB — ECHOCARDIOGRAM COMPLETE
Area-P 1/2: 4.26 cm2
S' Lateral: 2.3 cm

## 2024-03-17 ENCOUNTER — Ambulatory Visit: Admitting: Family Medicine

## 2024-08-04 ENCOUNTER — Encounter: Payer: Self-pay | Admitting: Neurology

## 2024-09-01 NOTE — Progress Notes (Deleted)
 Initial neurology clinic note  Hailey Hodge MRN: [id number] DOB: 10-21-1995  Referring provider: Ina Marcellus RAMAN, MD  Primary care provider: Ina Marcellus RAMAN, MD  Reason for consult:  headaches  Subjective:  This is Ms. Hailey Hodge, a 28 y.o. ***-handed female with a medical history of migraines, asthma, anxiety*** who presents to neurology clinic with headaches***. The patient is accompanied by ***.  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  Currently on citalopram, atenolol for tachycardia B12, vit D  Smoker: OCP use: Caffiene use: EtOH use: Restrictive diet: Family history of neurologic disease including headaches:   MEDICATIONS:  Outpatient Encounter Medications as of 09/15/2024  Medication Sig   albuterol  (PROVENTIL  HFA;VENTOLIN  HFA) 108 (90 BASE) MCG/ACT inhaler Inhale 2 puffs into the lungs every 6 (six) hours as needed for wheezing.   citalopram (CELEXA) 10 MG tablet Take 10 mg by mouth daily.   famotidine  (PEPCID ) 20 MG tablet Take 1 tablet (20 mg total) by mouth daily.   montelukast (SINGULAIR) 10 MG tablet Take 10 mg by mouth at bedtime.   naproxen  (NAPROSYN ) 375 MG tablet Take 1 tablet (375 mg total) by mouth 2 (two) times daily with a meal.   norethindrone-ethinyl estradiol (MICROGESTIN,JUNEL,LOESTRIN) 1-20 MG-MCG tablet Take 1 tablet by mouth daily.   pantoprazole (PROTONIX) 20 MG tablet Take 20 mg by mouth daily.   No facility-administered encounter medications on file as of 09/15/2024.    PAST MEDICAL HISTORY: Past Medical History:  Diagnosis Date   Asthma 01/15/2012   Cervicalgia 01/29/2013   Chronic tension headaches  08/16/2013   GERD (gastroesophageal reflux disease)    Midline thoracic back pain 11/29/2014   Migraine with aura 01/29/2013   Migraine without aura 01/29/2013   Muscular neck pain on left side 05/30/2015   Nonspecific chest pain 12/03/2023   Personal history of scoliosis 11/29/2014    PAST SURGICAL HISTORY: Past Surgical History:  Procedure Laterality Date   SALPINGOOPHORECTOMY  08-23-98    ALLERGIES: Allergies[1]  FAMILY HISTORY: Family History  Problem Relation Age of Onset   Stroke Paternal Grandfather    Colon cancer Maternal Grandfather        Died at 76   Migraines Mother    Anxiety disorder Mother    Migraines Maternal Grandmother    Depression Maternal Grandmother    Bipolar disorder Maternal Grandmother     SOCIAL HISTORY: Social History[2] Social History   Social History Narrative   Scientist, 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    She is a Holiday Representative at [employer].   She lives with her mom and her sister.   [place] enjoys homework, TV, social media, book clubs, key club, music and dance.   Working at [employer]     Objective:  Vital Signs:  There were no vitals taken for this visit.  ***  Labs and Imaging review: Internal labs: Lipid panel (11/21/23): tChol 195, LDL 131, TG 140  External labs: 01/30/24: B12: 196 TSH wnl ANA neg Lyme neg HbA1c: 5.3 CBC unremarkable CMP unremarkable  Imaging/Procedures: CT head and CTA head and neck (06/08/22): IMPRESSION: 1. Normal CTA of the head and neck. No acute traumatic injury to  the major arterial vasculature of the head and neck. No hemodynamically significant or correctable stenosis. 2. No other acute intracranial abnormality.  EKG (12/04/23): NSR - QT normal ***  Assessment/Plan:  Hailey Hodge is a 28 y.o. female who presents for evaluation of ***. *** has a relevant medical history of ***. *** neurological examination is pertinent for ***. Available diagnostic data is significant for ***. This  constellation of symptoms and objective data would most likely localize to ***. ***  PLAN: -Blood work: *** ***  -Return to clinic ***  The impression above as well as the plan as outlined below were extensively discussed with the patient (in the company of ***) who voiced understanding. All questions were answered to their satisfaction.  The patient was counseled on pertinent fall precautions per the printed material provided today, and as noted under the Patient Instructions section below.***  When available, results of the above investigations and possible further recommendations will be communicated to the patient via telephone/MyChart. Patient to call office if not contacted after expected testing turnaround time.   Total time spent reviewing records, interview, history/exam, documentation, and coordination of care on day of encounter:  *** min   Thank you for allowing me to participate in patient's care.  If I can answer any additional questions, I would be pleased to do so.  Venetia Potters, MD   CC: Ina Marcellus RAMAN, MD [address] [place] [address]  CC: Referring provider: Ina Marcellus RAMAN, MD [address] [place] [address],     [1]  Allergies Allergen Reactions   Prednisone     Other reaction(s): Other (See Comments)  my muscles tightened up  Other Reaction(s): Other (See Comments)  my muscles tightened up    Other reaction(s): Other (See Comments) my muscles tightened up  [2]  Social History Tobacco Use   Smoking status: Passive Smoke Exposure - Never Smoker   Smokeless tobacco: Never  Vaping Use   Vaping status: Never Used  Substance Use Topics   Alcohol use: Yes   Drug use: No

## 2024-09-15 ENCOUNTER — Ambulatory Visit: Admitting: Neurology

## 2024-09-15 ENCOUNTER — Encounter: Payer: Self-pay | Admitting: Neurology

## 2024-12-23 ENCOUNTER — Ambulatory Visit: Payer: Self-pay | Admitting: Neurology
